# Patient Record
Sex: Female | Born: 1955 | Race: Black or African American | Hispanic: No | Marital: Single | Smoking: Never smoker
Health system: Southern US, Community
[De-identification: ages and names within clinical notes are randomized; demographics above are authoritative.]

## PROBLEM LIST (undated history)

## (undated) DIAGNOSIS — L309 Dermatitis, unspecified: Secondary | ICD-10-CM

## (undated) DIAGNOSIS — Z789 Other specified health status: Secondary | ICD-10-CM

## (undated) DIAGNOSIS — J45909 Unspecified asthma, uncomplicated: Secondary | ICD-10-CM

## (undated) DIAGNOSIS — D219 Benign neoplasm of connective and other soft tissue, unspecified: Secondary | ICD-10-CM

## (undated) HISTORY — PX: ABDOMINAL HYSTERECTOMY: SHX81

## (undated) HISTORY — DX: Benign neoplasm of connective and other soft tissue, unspecified: D21.9

---

## 1997-10-31 ENCOUNTER — Emergency Department (HOSPITAL_COMMUNITY): Admission: EM | Admit: 1997-10-31 | Discharge: 1997-10-31 | Payer: Self-pay | Admitting: Emergency Medicine

## 1997-11-12 ENCOUNTER — Emergency Department (HOSPITAL_COMMUNITY): Admission: EM | Admit: 1997-11-12 | Discharge: 1997-11-12 | Payer: Self-pay | Admitting: Emergency Medicine

## 1998-10-10 ENCOUNTER — Emergency Department (HOSPITAL_COMMUNITY): Admission: EM | Admit: 1998-10-10 | Discharge: 1998-10-10 | Payer: Self-pay | Admitting: Emergency Medicine

## 1998-12-21 ENCOUNTER — Encounter: Admission: RE | Admit: 1998-12-21 | Discharge: 1998-12-21 | Payer: Self-pay | Admitting: Family Medicine

## 1998-12-21 ENCOUNTER — Encounter: Payer: Self-pay | Admitting: Family Medicine

## 2000-02-15 ENCOUNTER — Encounter: Admission: RE | Admit: 2000-02-15 | Discharge: 2000-02-15 | Payer: Self-pay | Admitting: Family Medicine

## 2000-02-15 ENCOUNTER — Encounter: Payer: Self-pay | Admitting: Family Medicine

## 2000-03-15 ENCOUNTER — Encounter: Payer: Self-pay | Admitting: Family Medicine

## 2000-03-15 ENCOUNTER — Encounter: Admission: RE | Admit: 2000-03-15 | Discharge: 2000-03-15 | Payer: Self-pay | Admitting: Family Medicine

## 2000-11-23 ENCOUNTER — Other Ambulatory Visit: Admission: RE | Admit: 2000-11-23 | Discharge: 2000-11-23 | Payer: Self-pay | Admitting: *Deleted

## 2000-11-23 ENCOUNTER — Encounter (INDEPENDENT_AMBULATORY_CARE_PROVIDER_SITE_OTHER): Payer: Self-pay | Admitting: Specialist

## 2001-10-10 ENCOUNTER — Other Ambulatory Visit: Admission: RE | Admit: 2001-10-10 | Discharge: 2001-10-10 | Payer: Self-pay | Admitting: *Deleted

## 2003-01-15 ENCOUNTER — Other Ambulatory Visit: Admission: RE | Admit: 2003-01-15 | Discharge: 2003-01-15 | Payer: Self-pay | Admitting: *Deleted

## 2004-08-22 ENCOUNTER — Other Ambulatory Visit: Admission: RE | Admit: 2004-08-22 | Discharge: 2004-08-22 | Payer: Self-pay | Admitting: *Deleted

## 2005-07-27 ENCOUNTER — Other Ambulatory Visit: Admission: RE | Admit: 2005-07-27 | Discharge: 2005-07-27 | Payer: Self-pay | Admitting: Family Medicine

## 2005-10-27 ENCOUNTER — Encounter: Admission: RE | Admit: 2005-10-27 | Discharge: 2005-10-27 | Payer: Self-pay | Admitting: Family Medicine

## 2006-01-15 ENCOUNTER — Encounter: Admission: RE | Admit: 2006-01-15 | Discharge: 2006-01-15 | Payer: Self-pay | Admitting: Family Medicine

## 2006-08-03 ENCOUNTER — Emergency Department (HOSPITAL_COMMUNITY): Admission: EM | Admit: 2006-08-03 | Discharge: 2006-08-03 | Payer: Self-pay | Admitting: Emergency Medicine

## 2007-01-16 ENCOUNTER — Other Ambulatory Visit: Admission: RE | Admit: 2007-01-16 | Discharge: 2007-01-16 | Payer: Self-pay | Admitting: Obstetrics and Gynecology

## 2008-04-22 ENCOUNTER — Emergency Department: Payer: Self-pay | Admitting: Unknown Physician Specialty

## 2008-11-25 ENCOUNTER — Ambulatory Visit: Payer: Self-pay

## 2009-10-19 ENCOUNTER — Ambulatory Visit (HOSPITAL_COMMUNITY): Admission: RE | Admit: 2009-10-19 | Discharge: 2009-10-19 | Payer: Self-pay | Admitting: Family Medicine

## 2010-03-20 ENCOUNTER — Encounter: Payer: Self-pay | Admitting: Family Medicine

## 2010-09-02 ENCOUNTER — Emergency Department: Payer: Self-pay | Admitting: Emergency Medicine

## 2010-11-30 ENCOUNTER — Ambulatory Visit: Payer: Self-pay

## 2010-12-01 ENCOUNTER — Ambulatory Visit: Payer: Self-pay

## 2011-06-01 ENCOUNTER — Emergency Department: Payer: Self-pay | Admitting: *Deleted

## 2011-12-20 ENCOUNTER — Ambulatory Visit: Payer: Self-pay

## 2013-02-12 ENCOUNTER — Ambulatory Visit: Payer: Self-pay

## 2013-12-23 ENCOUNTER — Emergency Department: Payer: Self-pay | Admitting: Emergency Medicine

## 2013-12-23 LAB — COMPREHENSIVE METABOLIC PANEL
ALBUMIN: 3.5 g/dL (ref 3.4–5.0)
AST: 24 U/L (ref 15–37)
Alkaline Phosphatase: 95 U/L
Anion Gap: 7 (ref 7–16)
BUN: 10 mg/dL (ref 7–18)
Bilirubin,Total: 0.4 mg/dL (ref 0.2–1.0)
CO2: 31 mmol/L (ref 21–32)
CREATININE: 0.88 mg/dL (ref 0.60–1.30)
Calcium, Total: 8.2 mg/dL — ABNORMAL LOW (ref 8.5–10.1)
Chloride: 106 mmol/L (ref 98–107)
EGFR (Non-African Amer.): >60
GLUCOSE: 92 mg/dL (ref 65–99)
OSMOLALITY: 286 (ref 275–301)
Potassium: 3.2 mmol/L — ABNORMAL LOW (ref 3.5–5.1)
SGPT (ALT): 25 U/L
Sodium: 144 mmol/L (ref 136–145)
TOTAL PROTEIN: 7.5 g/dL (ref 6.4–8.2)

## 2013-12-23 LAB — CBC
HCT: 37.2 % (ref 35.0–47.0)
HGB: 11.9 g/dL — ABNORMAL LOW (ref 12.0–16.0)
MCH: 27 pg (ref 26.0–34.0)
MCHC: 31.9 g/dL — ABNORMAL LOW (ref 32.0–36.0)
MCV: 85 fL (ref 80–100)
PLATELETS: 172 10*3/uL (ref 150–440)
RBC: 4.4 10*6/uL (ref 3.80–5.20)
RDW: 13.5 % (ref 11.5–14.5)
WBC: 5.4 10*3/uL (ref 3.6–11.0)

## 2013-12-23 LAB — TROPONIN I

## 2013-12-23 LAB — D-DIMER(ARMC): D-DIMER: 241 ng/mL

## 2013-12-23 LAB — CK TOTAL AND CKMB (NOT AT ARMC)
CK, Total: 271 U/L — ABNORMAL HIGH
CK-MB: 1.8 ng/mL (ref 0.5–3.6)

## 2015-04-27 ENCOUNTER — Emergency Department
Admission: EM | Admit: 2015-04-27 | Discharge: 2015-04-27 | Disposition: A | Discharge status: Home / Self Care | Payer: No Typology Code available for payment source | Attending: Emergency Medicine | Admitting: Emergency Medicine

## 2015-04-27 ENCOUNTER — Encounter: Payer: Self-pay | Admitting: Emergency Medicine

## 2015-04-27 DIAGNOSIS — Y9389 Activity, other specified: Secondary | ICD-10-CM | POA: Diagnosis not present

## 2015-04-27 DIAGNOSIS — M62838 Other muscle spasm: Secondary | ICD-10-CM

## 2015-04-27 DIAGNOSIS — S0990XA Unspecified injury of head, initial encounter: Secondary | ICD-10-CM | POA: Insufficient documentation

## 2015-04-27 DIAGNOSIS — S8991XA Unspecified injury of right lower leg, initial encounter: Secondary | ICD-10-CM | POA: Diagnosis not present

## 2015-04-27 DIAGNOSIS — Y998 Other external cause status: Secondary | ICD-10-CM | POA: Insufficient documentation

## 2015-04-27 DIAGNOSIS — Y92410 Unspecified street and highway as the place of occurrence of the external cause: Secondary | ICD-10-CM | POA: Insufficient documentation

## 2015-04-27 DIAGNOSIS — S4992XA Unspecified injury of left shoulder and upper arm, initial encounter: Secondary | ICD-10-CM | POA: Diagnosis not present

## 2015-04-27 DIAGNOSIS — S199XXA Unspecified injury of neck, initial encounter: Secondary | ICD-10-CM | POA: Diagnosis present

## 2015-04-27 MED ORDER — CYCLOBENZAPRINE HCL 10 MG PO TABS: 10.0000 mg | ORAL_TABLET | Freq: Three times a day (TID) | ORAL | Status: DC | PRN

## 2015-04-27 MED ORDER — NAPROXEN SODIUM 550 MG PO TABS: 550.0000 mg | ORAL_TABLET | Freq: Two times a day (BID) | ORAL | Status: DC
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
## 2016-03-21 ENCOUNTER — Other Ambulatory Visit: Payer: Self-pay | Admitting: Obstetrics and Gynecology

## 2016-03-21 DIAGNOSIS — Z1231 Encounter for screening mammogram for malignant neoplasm of breast: Secondary | ICD-10-CM

## 2016-04-12 ENCOUNTER — Ambulatory Visit
Admission: RE | Admit: 2016-04-12 | Discharge: 2016-04-12 | Disposition: A | Discharge status: Home / Self Care | Payer: BLUE CROSS/BLUE SHIELD | Source: Ambulatory Visit | Attending: Obstetrics and Gynecology | Admitting: Obstetrics and Gynecology

## 2016-04-12 DIAGNOSIS — Z1231 Encounter for screening mammogram for malignant neoplasm of breast: Secondary | ICD-10-CM | POA: Diagnosis not present

## 2016-05-08 ENCOUNTER — Ambulatory Visit (INDEPENDENT_AMBULATORY_CARE_PROVIDER_SITE_OTHER): Payer: BLUE CROSS/BLUE SHIELD | Admitting: Obstetrics and Gynecology

## 2016-05-08 ENCOUNTER — Encounter: Payer: Self-pay | Admitting: Obstetrics and Gynecology

## 2016-05-08 VITALS — BP 114/70 | Ht 65.0 in | Wt 155.0 lb

## 2016-05-08 DIAGNOSIS — Z0000 Encounter for general adult medical examination without abnormal findings: Secondary | ICD-10-CM

## 2016-05-08 DIAGNOSIS — Z113 Encounter for screening for infections with a predominantly sexual mode of transmission: Secondary | ICD-10-CM

## 2016-05-08 DIAGNOSIS — Z13220 Encounter for screening for lipoid disorders: Secondary | ICD-10-CM

## 2016-05-08 DIAGNOSIS — Z131 Encounter for screening for diabetes mellitus: Secondary | ICD-10-CM | POA: Diagnosis not present

## 2016-05-08 DIAGNOSIS — R3121 Asymptomatic microscopic hematuria: Secondary | ICD-10-CM

## 2016-05-08 DIAGNOSIS — Z1329 Encounter for screening for other suspected endocrine disorder: Secondary | ICD-10-CM | POA: Diagnosis not present

## 2016-05-08 DIAGNOSIS — Z124 Encounter for screening for malignant neoplasm of cervix: Secondary | ICD-10-CM

## 2016-05-08 DIAGNOSIS — N3001 Acute cystitis with hematuria: Secondary | ICD-10-CM

## 2016-05-08 DIAGNOSIS — Z01419 Encounter for gynecological examination (general) (routine) without abnormal findings: Secondary | ICD-10-CM

## 2016-05-08 NOTE — Progress Notes (Addendum)
Routine Annual Gynecology Examination   PCP: No PCP Per Patient  Chief Complaint  Patient presents with  . Annual Exam    History of Present Illness: Patient is a 61 y.o. [id number] presents for annual exam. The patient has no complaints today.   Menopausal bleeding: denies  Menopausal symptoms: denies  Breast symptoms: reports a skin lesion on her right side that began as a pimple in 2012 and has steadily gotten bigger. She states that she was told that the spot looked like a keloid when she was getting a mammogram.   Last pap smear: 2 years ago.  Result Normal  (Per patient report)  Last mammogram: 1 month ago.  Result Normal   Has been having a week of "loud" and abnormal smelling urine. Would like a UA.   Past Medical History:  Diagnosis Date  . Fibroids    Past Surgical History:  Procedure Laterality Date  . ABDOMINAL HYSTERECTOMY     pt still has ovaries and cervix   Medications:   Medication Sig Start Date End Date Taking? Authorizing Provider  cyclobenzaprine (FLEXERIL) 10 MG tablet Take 1 tablet (10 mg total) by mouth 3 (three) times daily as needed for muscle spasms. Patient not taking: Reported on 05/08/2016 04/27/15   Jami L Hagler, PA-C  naproxen sodium (ANAPROX) 550 MG tablet Take 1 tablet (550 mg total) by mouth 2 (two) times daily with a meal. Patient not taking: Reported on 05/08/2016 04/27/15   Jami L Hagler, PA-C    Allergies: No Known Allergies  Gynecologic History:  No LMP recorded. Patient has had a hysterectomy. Contraception: none Last Pap: 2 years ago. Results were: normal Last mammogram: 1 month ago. Results were: normal  Obstetric History: [id number]  Social History   Social History  . Marital status: Single    Spouse name: N/A  . Number of children: N/A  . Years of education: N/A   Occupational History  . Not on file.   Social History Main Topics  . Smoking status: Never Smoker  . Smokeless tobacco: Never Used  . Alcohol use No    . Drug use: No  . Sexual activity: Yes    Birth control/ protection: Surgical   Other Topics Concern  . Not on file   Social History Narrative  . No narrative on file    Family History  Problem Relation Age of Onset  . Breast cancer Paternal Aunt 109  . Lung cancer Mother     Review of Systems  Constitutional: Negative.   HENT: Negative.   Eyes: Negative.  Negative for discharge.  Respiratory: Negative.   Cardiovascular: Negative.   Gastrointestinal: Negative.   Genitourinary: Negative.   Musculoskeletal: Negative.   Skin:       Mark on right breast near axilla. Present to increasing degrees since 2012. Itches.   Endo/Heme/Allergies: Negative.   Psychiatric/Behavioral: Negative.      Physical Exam Vitals: BP 114/70   Ht 5\' 5"  (1.651 m)   Wt 155 lb (70.3 kg)   BMI 25.79 kg/m   Physical Exam  Constitutional: She is oriented to person, place, and time and well-developed, well-nourished, and in no distress. No distress.  HENT:  Head: Normocephalic and atraumatic.  Eyes: Conjunctivae and EOM are normal. Right eye exhibits no discharge. Left eye exhibits no discharge.  Neck: Neck supple. No tracheal deviation present. No thyromegaly present.  Cardiovascular: Normal rate, regular rhythm, normal heart sounds and intact distal pulses.  Exam reveals no gallop  and no friction rub.   No murmur heard. Pulmonary/Chest: Effort normal and breath sounds normal. No respiratory distress. She has no wheezes. She has no rales. She exhibits no tenderness.    Abdominal: Soft. Bowel sounds are normal. She exhibits no distension and no mass. There is tenderness (mild tenderness in LLQ). There is no rebound and no guarding.  Genitourinary: Vagina normal, right adnexa normal, left adnexa normal and vulva normal. Right adnexum displays no mass. Left adnexum displays no mass.  Genitourinary Comments: Uterus surgically absent  Musculoskeletal: Normal range of motion. She exhibits no edema,  tenderness or deformity.  Lymphadenopathy:    She has no cervical adenopathy.  Neurological: She is alert and oriented to person, place, and time. No cranial nerve deficit.  Skin: Skin is warm and dry.  Psychiatric: Mood, memory, affect and judgment normal.     Female chaperone present for pelvic and breast  portions of the physical exam  Results:  Audit Questionaire: 1 PHQ-9: 0  Assessment and Plan:  61 y.o. [id number] for routine annual exam.  Right axillary skin lesion, appears keloidal.   Plan: Problem List Items Addressed This Visit    None    Visit Diagnoses    Women's annual routine gynecological examination    -  Primary   Screening for diabetes mellitus       Relevant Orders   Hgb A1c w/o eAG   Screen for STD (sexually transmitted disease)       Relevant Orders   IGP,CtNg,AptimaHPV,rfx16/18,45   Pap smear for cervical cancer screening       Relevant Orders   IGP,CtNg,AptimaHPV,rfx16/18,45   Screening for thyroid disorder       Relevant Orders   TSH   Laboratory exam ordered as part of routine general medical examination       Relevant Orders   Comprehensive metabolic panel   CBC   Screening for lipid disorders       Relevant Orders   Lipid Panel With LDL/HDL Ratio   Asymptomatic microscopic hematuria       Relevant Orders   Urine culture      1) Mammogram  - recommend yearly screening mammogram: normal last month. Routine screening recommended.   2) STI screening was offered done  3) Pap smear - ASCCP guidelines and rational discussed.  Patient opts for routine screening interval - Pap  done  4) Osteoporosis  - per USPTF routine screening DEXA at age 46  5) Routine healthcare maintenance including cholesterol, diabetes screening done today  6) Keloidal lesion: Recommend she see a dermatologist. Offered referral, if needed.  7) Hematuria: send urine culture.  Refer to Urology, if negative  8) Follow up 1 year for routine annual  Prentice Docker, MD 05/08/2016 10:17 AM    ADDENDUM: Urine culture returned showing pansensitive e. Coli. Bactrim DS sent to pharmacy for patient.   She will be notified.

## 2016-05-09 LAB — COMPREHENSIVE METABOLIC PANEL
ALBUMIN: 4.2 g/dL (ref 3.5–5.5)
ALK PHOS: 101 IU/L (ref 39–117)
ALT: 19 IU/L (ref 0–32)
AST: 21 IU/L (ref 0–40)
Albumin/Globulin Ratio: 1.4 (ref 1.2–2.2)
BILIRUBIN TOTAL: 0.2 mg/dL (ref 0.0–1.2)
BUN / CREAT RATIO: 13 (ref 9–23)
BUN: 8 mg/dL (ref 6–24)
CHLORIDE: 104 mmol/L (ref 96–106)
CO2: 25 mmol/L (ref 18–29)
Calcium: 9.1 mg/dL (ref 8.7–10.2)
Creatinine, Ser: 0.63 mg/dL (ref 0.57–1.00)
GFR calc Af Amer: 114 mL/min/{1.73_m2} (ref >59)
GFR calc non Af Amer: 99 mL/min/{1.73_m2} (ref >59)
GLUCOSE: 107 mg/dL — AB (ref 65–99)
Globulin, Total: 3 g/dL (ref 1.5–4.5)
POTASSIUM: 3.8 mmol/L (ref 3.5–5.2)
SODIUM: 143 mmol/L (ref 134–144)
Total Protein: 7.2 g/dL (ref 6.0–8.5)

## 2016-05-09 LAB — HGB A1C W/O EAG: Hgb A1c MFr Bld: 5.7 % — ABNORMAL HIGH (ref 4.8–5.6)

## 2016-05-09 LAB — LIPID PANEL WITH LDL/HDL RATIO
CHOLESTEROL TOTAL: 238 mg/dL — AB (ref 100–199)
HDL: 68 mg/dL (ref >39)
LDL Calculated: 154 mg/dL — ABNORMAL HIGH (ref 0–99)
LDl/HDL Ratio: 2.3 ratio units (ref 0.0–3.2)
TRIGLYCERIDES: 81 mg/dL (ref 0–149)
VLDL Cholesterol Cal: 16 mg/dL (ref 5–40)

## 2016-05-09 LAB — CBC
HEMATOCRIT: 37.8 % (ref 34.0–46.6)
Hemoglobin: 12.4 g/dL (ref 11.1–15.9)
MCH: 27.3 pg (ref 26.6–33.0)
MCHC: 32.8 g/dL (ref 31.5–35.7)
MCV: 83 fL (ref 79–97)
PLATELETS: 217 10*3/uL (ref 150–379)
RBC: 4.55 x10E6/uL (ref 3.77–5.28)
RDW: 15.2 % (ref 12.3–15.4)
WBC: 6.4 10*3/uL (ref 3.4–10.8)

## 2016-05-09 LAB — TSH: TSH: 2.36 u[IU]/mL (ref 0.450–4.500)

## 2016-05-11 LAB — URINE CULTURE

## 2016-05-11 LAB — IGP,CTNG,APTIMAHPV,RFX16/18,45
Chlamydia, Nuc. Acid Amp: NEGATIVE
GONOCOCCUS BY NUCLEIC ACID AMP: NEGATIVE
HPV APTIMA: NEGATIVE
PAP SMEAR COMMENT: 0

## 2016-05-13 ENCOUNTER — Encounter: Payer: Self-pay | Admitting: Obstetrics and Gynecology

## 2016-05-13 MED ORDER — SULFAMETHOXAZOLE-TRIMETHOPRIM 800-160 MG PO TABS: 1.0000 | ORAL_TABLET | Freq: Two times a day (BID) | ORAL | 0 refills | Status: AC

## 2016-05-13 NOTE — Addendum Note (Signed)
Addended by: Prentice Docker D on: 05/13/2016 07:29 PM   Modules accepted: Orders

## 2016-05-17 ENCOUNTER — Telehealth: Payer: Self-pay

## 2016-05-17 NOTE — Telephone Encounter (Signed)
Spoke w/patient. Notified SDJ had sent her results thru my chart. Patient states she isn't sure how to get into my chart and asks that SDJ not send her messages thru my chart. Patient verbally given results that SDJ sent thru my chart. Patient states she did p/u abx from pharmacy. Patient did not have any further questions. Sticky note created for no communication thru my chart.

## 2016-05-17 NOTE — Telephone Encounter (Signed)
Pt calling for lab results from 05/08/16. [phone]

## 2016-08-04 DIAGNOSIS — R5381 Other malaise: Secondary | ICD-10-CM | POA: Diagnosis not present

## 2016-08-04 DIAGNOSIS — I10 Essential (primary) hypertension: Secondary | ICD-10-CM | POA: Diagnosis not present

## 2016-08-08 DIAGNOSIS — E876 Hypokalemia: Secondary | ICD-10-CM | POA: Diagnosis not present

## 2016-09-29 DIAGNOSIS — E639 Nutritional deficiency, unspecified: Secondary | ICD-10-CM | POA: Diagnosis not present

## 2016-09-29 DIAGNOSIS — R5383 Other fatigue: Secondary | ICD-10-CM | POA: Diagnosis not present

## 2016-09-29 DIAGNOSIS — E889 Metabolic disorder, unspecified: Secondary | ICD-10-CM | POA: Diagnosis not present

## 2016-09-29 DIAGNOSIS — E668 Other obesity: Secondary | ICD-10-CM | POA: Diagnosis not present

## 2016-10-03 DIAGNOSIS — L910 Hypertrophic scar: Secondary | ICD-10-CM | POA: Diagnosis not present

## 2016-10-03 DIAGNOSIS — R946 Abnormal results of thyroid function studies: Secondary | ICD-10-CM | POA: Diagnosis not present

## 2016-10-03 DIAGNOSIS — Z113 Encounter for screening for infections with a predominantly sexual mode of transmission: Secondary | ICD-10-CM | POA: Diagnosis not present

## 2016-10-03 DIAGNOSIS — Z0000 Encounter for general adult medical examination without abnormal findings: Secondary | ICD-10-CM | POA: Diagnosis not present

## 2016-10-03 DIAGNOSIS — Z136 Encounter for screening for cardiovascular disorders: Secondary | ICD-10-CM | POA: Diagnosis not present

## 2016-10-03 DIAGNOSIS — Z1389 Encounter for screening for other disorder: Secondary | ICD-10-CM | POA: Diagnosis not present

## 2016-10-03 DIAGNOSIS — E876 Hypokalemia: Secondary | ICD-10-CM | POA: Diagnosis not present

## 2016-10-03 DIAGNOSIS — R87610 Atypical squamous cells of undetermined significance on cytologic smear of cervix (ASC-US): Secondary | ICD-10-CM | POA: Diagnosis not present

## 2016-10-03 DIAGNOSIS — D500 Iron deficiency anemia secondary to blood loss (chronic): Secondary | ICD-10-CM | POA: Diagnosis not present

## 2016-11-14 DIAGNOSIS — L910 Hypertrophic scar: Secondary | ICD-10-CM | POA: Diagnosis not present

## 2016-11-14 DIAGNOSIS — D485 Neoplasm of uncertain behavior of skin: Secondary | ICD-10-CM | POA: Diagnosis not present

## 2016-11-14 DIAGNOSIS — N6459 Other signs and symptoms in breast: Secondary | ICD-10-CM | POA: Diagnosis not present

## 2017-01-23 DIAGNOSIS — Z87891 Personal history of nicotine dependence: Secondary | ICD-10-CM | POA: Diagnosis not present

## 2017-01-23 DIAGNOSIS — M5489 Other dorsalgia: Secondary | ICD-10-CM | POA: Diagnosis not present

## 2017-01-23 DIAGNOSIS — M67911 Unspecified disorder of synovium and tendon, right shoulder: Secondary | ICD-10-CM | POA: Diagnosis not present

## 2017-01-23 DIAGNOSIS — L299 Pruritus, unspecified: Secondary | ICD-10-CM | POA: Diagnosis not present

## 2017-01-23 DIAGNOSIS — M25511 Pain in right shoulder: Secondary | ICD-10-CM | POA: Diagnosis not present

## 2017-01-23 DIAGNOSIS — B9689 Other specified bacterial agents as the cause of diseases classified elsewhere: Secondary | ICD-10-CM | POA: Diagnosis not present

## 2017-01-23 DIAGNOSIS — N760 Acute vaginitis: Secondary | ICD-10-CM | POA: Diagnosis not present

## 2017-03-26 ENCOUNTER — Encounter: Payer: Self-pay | Admitting: *Deleted

## 2017-03-26 ENCOUNTER — Ambulatory Visit
Admission: EM | Admit: 2017-03-26 | Discharge: 2017-03-26 | Disposition: A | Discharge status: Home / Self Care | Payer: 59 | Attending: Family Medicine | Admitting: Family Medicine

## 2017-03-26 DIAGNOSIS — N760 Acute vaginitis: Secondary | ICD-10-CM | POA: Insufficient documentation

## 2017-03-26 DIAGNOSIS — B9689 Other specified bacterial agents as the cause of diseases classified elsewhere: Secondary | ICD-10-CM | POA: Insufficient documentation

## 2017-03-26 DIAGNOSIS — Z113 Encounter for screening for infections with a predominantly sexual mode of transmission: Secondary | ICD-10-CM

## 2017-03-26 DIAGNOSIS — N898 Other specified noninflammatory disorders of vagina: Secondary | ICD-10-CM

## 2017-03-26 LAB — URINALYSIS, COMPLETE (UACMP) WITH MICROSCOPIC
Bilirubin Urine: NEGATIVE
GLUCOSE, UA: NEGATIVE mg/dL
Ketones, ur: NEGATIVE mg/dL
Leukocytes, UA: NEGATIVE
NITRITE: NEGATIVE
PROTEIN: NEGATIVE mg/dL
SPECIFIC GRAVITY, URINE: 1.025 (ref 1.005–1.030)
pH: 6 (ref 5.0–8.0)

## 2017-03-26 LAB — WET PREP, GENITAL
SPERM: NONE SEEN
Trich, Wet Prep: NONE SEEN
Yeast Wet Prep HPF POC: NONE SEEN

## 2017-03-26 LAB — GLUCOSE, CAPILLARY: Glucose-Capillary: 92 mg/dL (ref 65–99)

## 2017-03-26 MED ORDER — METRONIDAZOLE 500 MG PO TABS: 500.0000 mg | ORAL_TABLET | Freq: Two times a day (BID) | ORAL | 0 refills | Status: AC

## 2017-03-26 NOTE — ED Triage Notes (Signed)
Vaginal discharge, itching, x2 days.

## 2017-03-26 NOTE — Discharge Instructions (Signed)
Take medication as prescribed. Rest. Drink plenty of fluids. No alcohol with medication.    Follow up with your primary care physician as discussed. Return to Urgent care for new or worsening concerns.

## 2017-03-26 NOTE — ED Provider Notes (Signed)
[facility] ____________________________________________  Time seen: Approximately 9:15 PM  I have reviewed the triage vital signs and the nursing notes.   HISTORY  Chief Complaint Vaginal Discharge   HPI Beth Tucker is a 62 y.o. female presenting for evaluation of 2 days of some vaginal itching and vaginal discharge.  States vaginal discharge is whitish with infectious type odor.  States sexually active with same partner with rather last sexual activity approximately 2 weeks ago.  States would like to have gonorrhea and Chlamydia testing, declines other STD testing.  States not concerned highly for STDs.  Denies any vaginal pain, rash, lesions, pelvic pain, abdominal pain, fevers, vomiting or diarrhea.  Reports continues to eat and drink well.  States not diabetic.  Denies known trigger.  States history of vaginal yeast infections with similar presentation.  States last vaginal yeast infection was 2 or 3 months ago.  Reports otherwise feels well denies other complaints.  No over-the-counter medications taken for the same complaints.  Denies any accompanying urinary frequency, urinary urgency or burning with urination. Denies recent sickness. Denies recent antibiotic use.    Past Medical History:  Diagnosis Date  . Fibroids     There are no active problems to display for this patient.   Past Surgical History:  Procedure Laterality Date  . ABDOMINAL HYSTERECTOMY     pt still has ovaries and cervix     No current facility-administered medications for this encounter.   Current Outpatient Medications:  .  metroNIDAZOLE (FLAGYL) 500 MG tablet, Take 1 tablet (500 mg total) by mouth 2 (two) times daily for 7 days., Disp: 14 tablet, Rfl: 0  Allergies Neosporin [neomycin-bacitracin zn-polymyx]  Family History  Problem Relation Age of Onset  . Breast cancer Paternal Aunt 20  . Lung cancer Mother   . Alcoholism Father   . Hypertension Father     Social  History Social History   Tobacco Use  . Smoking status: Never Smoker  . Smokeless tobacco: Never Used  Substance Use Topics  . Alcohol use: No  . Drug use: No    Review of Systems Constitutional: No fever/chills Cardiovascular: Denies chest pain. Respiratory: Denies shortness of breath. Gastrointestinal: No abdominal pain. Genitourinary: Negative for dysuria. As above.  Musculoskeletal: Negative for back pain. Skin: Negative for rash.   ____________________________________________   PHYSICAL EXAM:  VITAL SIGNS: ED Triage Vitals  Enc Vitals Group     BP 03/26/17 2031 119/72     Pulse Rate 03/26/17 2031 71     Resp 03/26/17 2031 16     Temp 03/26/17 2031 98.4 F (36.9 C)     Temp Source 03/26/17 2031 Oral     SpO2 03/26/17 2031 100 %     Weight 03/26/17 2032 155 lb (70.3 kg)     Height 03/26/17 2032 5\' 4"  (1.626 m)     Head Circumference --      Peak Flow --      Pain Score 03/26/17 2145 3     Pain Loc --      Pain Edu? --      Excl. in [place]? --     Constitutional: Alert and oriented. Well appearing and in no acute distress. Cardiovascular: Normal rate, regular rhythm. Grossly normal heart sounds.  Good peripheral circulation. Respiratory: Normal respiratory effort without tachypnea nor retractions. Breath sounds are clear and equal bilaterally. No wheezes, rales, rhonchi. Gastrointestinal: Soft and nontender.No CVA tenderness. Pelvic: declined Musculoskeletal: No midline cervical, thoracic or lumbar  tenderness to palpation.  Neurologic:  Normal speech and language.Speech is normal. No gait instability.  Skin:  Skin is warm, dry and intact. No rash noted. Psychiatric: Mood and affect are normal. Speech and behavior are normal. Patient exhibits appropriate insight and judgment   ___________________________________________   LABS (all labs ordered are listed, but only abnormal results are displayed)  Labs Reviewed  WET PREP, GENITAL - Abnormal; Notable for the  following components:      Result Value   Clue Cells Wet Prep HPF POC PRESENT (*)    WBC, Wet Prep HPF POC FEW (*)    All other components within normal limits  URINALYSIS, COMPLETE (UACMP) WITH MICROSCOPIC - Abnormal; Notable for the following components:   APPearance HAZY (*)    Hgb urine dipstick SMALL (*)    Squamous Epithelial / LPF 6-30 (*)    Bacteria, UA FEW (*)    All other components within normal limits  URINE CULTURE  CHLAMYDIA/NGC RT PCR ([facility] ONLY)  GLUCOSE, CAPILLARY  CBG MONITORING, ED    PROCEDURES Procedures    INITIAL IMPRESSION / ASSESSMENT AND PLAN / ED COURSE  Pertinent labs & imaging results that were available during my care of the patient were reviewed by me and considered in my medical decision making (see chart for details).  Very well-appearing patient.  No acute distress.  Urinalysis reviewed, suspect contamination, patient denies dysuria patient declined pelvic exam and elected to do self wet prep and self gonorrhea and chlamydia.  Results reviewed, awaiting GC chlamydia.  Positive clue cells.  Will treat patient with oral Flagyl.  Glucose fingerstick normal.  Discussed pelvic rest, no alcohol with medication, encourage rest, fluids, supportive care.  Encouraged primary or OB/GYN follow-up.Discussed indication, risks and benefits of medications with patient.  Discussed follow up with Primary care physician this week. Discussed follow up and return parameters including no resolution or any worsening concerns. Patient verbalized understanding and agreed to plan.   ____________________________________________   FINAL CLINICAL IMPRESSION(S) / ED DIAGNOSES  Final diagnoses:  BV (bacterial vaginosis)  Acute vaginitis     ED Discharge Orders        Ordered    metroNIDAZOLE (FLAGYL) 500 MG tablet  2 times daily     03/26/17 2142       Note: This dictation was prepared with Dragon dictation along with smaller phrase technology. Any  transcriptional errors that result from this process are unintentional.         Marylene Land, NP 03/26/17 2200

## 2017-03-27 LAB — CHLAMYDIA/NGC RT PCR (ARMC ONLY)
Chlamydia Tr: NOT DETECTED
N gonorrhoeae: NOT DETECTED

## 2017-03-28 LAB — URINE CULTURE

## 2017-04-13 DIAGNOSIS — E876 Hypokalemia: Secondary | ICD-10-CM | POA: Diagnosis not present

## 2017-04-13 DIAGNOSIS — J310 Chronic rhinitis: Secondary | ICD-10-CM | POA: Diagnosis not present

## 2017-04-13 DIAGNOSIS — J309 Allergic rhinitis, unspecified: Secondary | ICD-10-CM | POA: Diagnosis not present

## 2017-04-20 ENCOUNTER — Telehealth: Payer: Self-pay | Admitting: Gastroenterology

## 2017-04-20 NOTE — Telephone Encounter (Signed)
PCP wants patient to schedule procedure.

## 2017-04-23 ENCOUNTER — Telehealth: Payer: Self-pay

## 2017-04-23 ENCOUNTER — Other Ambulatory Visit: Payer: Self-pay

## 2017-04-23 NOTE — Telephone Encounter (Signed)
LVM for pt to contact office to schedule colonoscopy. 

## 2017-04-24 ENCOUNTER — Telehealth: Payer: Self-pay

## 2017-04-24 NOTE — Telephone Encounter (Signed)
Gastroenterology Pre-Procedure Review  Request Date:  Requesting Physician: Dr.   PATIENT REVIEW QUESTIONS: The patient responded to the following health history questions as indicated:    1. Are you having any GI issues? no 2. Do you have a personal history of Polyps? no 3. Do you have a family history of Colon Cancer or Polyps? no 4. Diabetes Mellitus? no 5. Joint replacements in the past 12 months?no 6. Major health problems in the past 3 months?no 7. Any artificial heart valves, MVP, or defibrillator?no    MEDICATIONS & ALLERGIES:    Patient reports the following regarding taking any anticoagulation/antiplatelet therapy:   Plavix, Coumadin, Eliquis, Xarelto, Lovenox, Pradaxa, Brilinta, or Effient? no Aspirin? no  Patient confirms/reports the following medications:  Current Outpatient Medications  Medication Sig Dispense Refill  . ibuprofen (ADVIL,MOTRIN) 600 MG tablet Take 600 mg by mouth.    . triamcinolone acetonide (TRIESENCE) 40 MG/ML SUSP 40 mg.     No current facility-administered medications for this visit.     Patient confirms/reports the following allergies:  Allergies  Allergen Reactions  . Neosporin [Neomycin-Bacitracin Zn-Polymyx] Rash    No orders of the defined types were placed in this encounter.   AUTHORIZATION INFORMATION Primary Insurance: 1D#: Group #:  Secondary Insurance: 1D#: Group #:  SCHEDULE INFORMATION: Date:  Time: Location:

## 2017-04-26 ENCOUNTER — Other Ambulatory Visit: Payer: Self-pay

## 2017-04-26 DIAGNOSIS — Z1211 Encounter for screening for malignant neoplasm of colon: Secondary | ICD-10-CM

## 2017-04-26 NOTE — Telephone Encounter (Signed)
Gastroenterology Pre-Procedure Review  Request Date: 06/21/17 Requesting Physician: Dr. Allen Norris  PATIENT REVIEW QUESTIONS: The patient responded to the following health history questions as indicated:    1. Are you having any GI issues? no 2. Do you have a personal history of Polyps? no 3. Do you have a family history of Colon Cancer or Polyps? no 4. Diabetes Mellitus? no 5. Joint replacements in the past 12 months?no 6. Major health problems in the past 3 months?no 7. Any artificial heart valves, MVP, or defibrillator?no    MEDICATIONS & ALLERGIES:    Patient reports the following regarding taking any anticoagulation/antiplatelet therapy:   Plavix, Coumadin, Eliquis, Xarelto, Lovenox, Pradaxa, Brilinta, or Effient? no Aspirin? no  Patient confirms/reports the following medications:  Current Outpatient Medications  Medication Sig Dispense Refill  . ibuprofen (ADVIL,MOTRIN) 600 MG tablet Take 600 mg by mouth.    . triamcinolone acetonide (TRIESENCE) 40 MG/ML SUSP 40 mg.     No current facility-administered medications for this visit.     Patient confirms/reports the following allergies:  Allergies  Allergen Reactions  . Neosporin [Neomycin-Bacitracin Zn-Polymyx] Rash    No orders of the defined types were placed in this encounter.   AUTHORIZATION INFORMATION Primary Insurance: 1D#: Group #:  Secondary Insurance: 1D#: Group #:  SCHEDULE INFORMATION: Date: 06/21/17 Time: Location:[facility]

## 2017-06-18 ENCOUNTER — Other Ambulatory Visit: Payer: Self-pay

## 2017-06-18 ENCOUNTER — Encounter: Payer: Self-pay | Admitting: *Deleted

## 2017-06-18 DIAGNOSIS — Z1211 Encounter for screening for malignant neoplasm of colon: Secondary | ICD-10-CM

## 2017-06-18 MED ORDER — PEG 3350-KCL-NA BICARB-NACL 420 G PO SOLR: 4000.0000 mL | Freq: Once | ORAL | 0 refills | Status: AC

## 2017-06-19 ENCOUNTER — Encounter: Payer: Self-pay | Admitting: Anesthesiology

## 2017-06-19 NOTE — Discharge Instructions (Signed)
General Anesthesia, Adult, Care After °These instructions provide you with information about caring for yourself after your procedure. Your health care provider may also give you more specific instructions. Your treatment has been planned according to current medical practices, but problems sometimes occur. Call your health care provider if you have any problems or questions after your procedure. °What can I expect after the procedure? °After the procedure, it is common to have: °· Vomiting. °· A sore throat. °· Mental slowness. ° °It is common to feel: °· Nauseous. °· Cold or shivery. °· Sleepy. °· Tired. °· Sore or achy, even in parts of your body where you did not have surgery. ° °Follow these instructions at home: °For at least 24 hours after the procedure: °· Do not: °? Participate in activities where you could fall or become injured. °? Drive. °? Use heavy machinery. °? Drink alcohol. °? Take sleeping pills or medicines that cause drowsiness. °? Make important decisions or sign legal documents. °? Take care of children on your own. °· Rest. °Eating and drinking °· If you vomit, drink water, juice, or soup when you can drink without vomiting. °· Drink enough fluid to keep your urine clear or pale yellow. °· Make sure you have little or no nausea before eating solid foods. °· Follow the diet recommended by your health care provider. °General instructions °· Have a responsible adult stay with you until you are awake and alert. °· Return to your normal activities as told by your health care provider. Ask your health care provider what activities are safe for you. °· Take over-the-counter and prescription medicines only as told by your health care provider. °· If you smoke, do not smoke without supervision. °· Keep all follow-up visits as told by your health care provider. This is important. °Contact a health care provider if: °· You continue to have nausea or vomiting at home, and medicines are not helpful. °· You  cannot drink fluids or start eating again. °· You cannot urinate after 8-12 hours. °· You develop a skin rash. °· You have fever. °· You have increasing redness at the site of your procedure. °Get help right away if: °· You have difficulty breathing. °· You have chest pain. °· You have unexpected bleeding. °· You feel that you are having a life-threatening or urgent problem. °This information is not intended to replace advice given to you by your health care provider. Make sure you discuss any questions you have with your health care provider. °Document Released: 05/22/2000 Document Revised: 07/19/2015 Document Reviewed: 01/28/2015 °Elsevier Interactive Patient Education © 2018 [employer] Inc[employer] ° °

## 2017-06-21 ENCOUNTER — Telehealth: Payer: Self-pay | Admitting: Gastroenterology

## 2017-06-21 ENCOUNTER — Ambulatory Visit: Admission: RE | Admit: 2017-06-21 | Payer: 59 | Source: Ambulatory Visit | Admitting: Gastroenterology

## 2017-06-21 HISTORY — DX: Other specified health status: Z78.9

## 2017-06-21 SURGERY — COLONOSCOPY WITH PROPOFOL
Anesthesia: General

## 2017-06-21 NOTE — Telephone Encounter (Signed)
Pt states she was supposed to have colonoscopy this morning but vomitted the prep she would like to reschedul;e and know what else she can take instead

## 2017-06-26 NOTE — Telephone Encounter (Signed)
Beth Tucker will callback with available date for reschedule.  Prep instructions at Reception.

## 2017-07-02 ENCOUNTER — Ambulatory Visit
Admission: EM | Admit: 2017-07-02 | Discharge: 2017-07-02 | Disposition: A | Discharge status: Home / Self Care | Payer: 59 | Attending: Family Medicine | Admitting: Family Medicine

## 2017-07-02 ENCOUNTER — Encounter: Payer: Self-pay | Admitting: Gynecology

## 2017-07-02 DIAGNOSIS — N760 Acute vaginitis: Secondary | ICD-10-CM

## 2017-07-02 DIAGNOSIS — B9689 Other specified bacterial agents as the cause of diseases classified elsewhere: Secondary | ICD-10-CM

## 2017-07-02 LAB — URINALYSIS, COMPLETE (UACMP) WITH MICROSCOPIC
Bacteria, UA: NONE SEEN
Bilirubin Urine: NEGATIVE
GLUCOSE, UA: NEGATIVE mg/dL
Leukocytes, UA: NEGATIVE
NITRITE: NEGATIVE
PH: 5.5 (ref 5.0–8.0)
Protein, ur: NEGATIVE mg/dL
Specific Gravity, Urine: 1.02 (ref 1.005–1.030)
WBC, UA: NONE SEEN WBC/hpf (ref 0–5)

## 2017-07-02 LAB — CHLAMYDIA/NGC RT PCR (ARMC ONLY)
Chlamydia Tr: NOT DETECTED
N gonorrhoeae: NOT DETECTED

## 2017-07-02 LAB — WET PREP, GENITAL
SPERM: NONE SEEN
Trich, Wet Prep: NONE SEEN
Yeast Wet Prep HPF POC: NONE SEEN

## 2017-07-02 MED ORDER — CLINDAMYCIN PHOSPHATE 100 MG VA SUPP: 100.0000 mg | Freq: Every day | VAGINAL | 0 refills | Status: DC

## 2017-07-02 NOTE — ED Provider Notes (Signed)
[facility]    CSN: [id number] Arrival date & time: 07/02/17  1638     History   Chief Complaint Chief Complaint  Patient presents with  . Urinary Tract Infection    HPI Beth Tucker is a 62 y.o. female.   HPI  62 year old female presents with low back and side pain and a vaginal odor with mild discharge.  She  had similar symptoms in January 2019 where she was treated with Flagyl but because due to the size of the pill was having difficulty finishing the entire course.  She has a monogamous relationship with a single female partner. Denies any frequency or urgency and has no dysuria.  He has had no fever or chills.  Not concerned for other STDs.        Past Medical History:  Diagnosis Date  . Fibroids   . Medical history non-contributory     There are no active problems to display for this patient.   Past Surgical History:  Procedure Laterality Date  . ABDOMINAL HYSTERECTOMY     pt still has ovaries and cervix    OB History    Gravida  4   Para  3   Term  3   Preterm      AB  1   Living  3     SAB      TAB      Ectopic      Multiple      Live Births  3            Home Medications    Prior to Admission medications   Medication Sig Start Date End Date Taking? Authorizing Provider  ibuprofen (ADVIL,MOTRIN) 600 MG tablet Take 600 mg by mouth. 01/23/17  Yes [provider]  triamcinolone acetonide (TRIESENCE) 40 MG/ML SUSP 40 mg. 11/14/16  Yes [provider]  clindamycin (CLEOCIN) 100 MG vaginal suppository Place 1 suppository (100 mg total) vaginally at bedtime. 07/02/17   Lorin Picket, PA-C    Family History Family History  Problem Relation Age of Onset  . Breast cancer Paternal Aunt 32  . Lung cancer Mother   . Alcoholism Father   . Hypertension Father     Social History Social History   Tobacco Use  . Smoking status: Never Smoker  . Smokeless tobacco: Never Used  Substance Use Topics  .  Alcohol use: No  . Drug use: No     Allergies   Latex; Neosporin [neomycin-bacitracin zn-polymyx]; and Peanut-containing drug products   Review of Systems Review of Systems  Genitourinary: Positive for pelvic pain and vaginal discharge. Negative for dyspareunia, dysuria, frequency, genital sores, hematuria, urgency and vaginal pain.  Musculoskeletal: Positive for back pain.     Physical Exam Triage Vital Signs ED Triage Vitals  Enc Vitals Group     BP 07/02/17 1652 116/66     Pulse Rate 07/02/17 1652 85     Resp 07/02/17 1652 16     Temp 07/02/17 1652 98.3 F (36.8 C)     Temp Source 07/02/17 1652 Oral     SpO2 07/02/17 1652 99 %     Weight 07/02/17 1650 155 lb (70.3 kg)     Height --      Head Circumference --      Peak Flow --      Pain Score 07/02/17 1650 0     Pain Loc --      Pain Edu? --  Excl. in GC? --    No data found.  Updated Vital Signs BP 116/66 (BP Location: Left Arm)   Pulse 85   Temp 98.3 F (36.8 C) (Oral)   Resp 16   Wt 155 lb (70.3 kg)   SpO2 99%   BMI 26.61 kg/m   Visual Acuity Right Eye Distance:   Left Eye Distance:   Bilateral Distance:    Right Eye Near:   Left Eye Near:    Bilateral Near:     Physical Exam  Constitutional: She is oriented to person, place, and time. She appears well-developed and well-nourished. No distress.  HENT:  Head: Normocephalic.  Eyes: Pupils are equal, round, and reactive to light. EOM are normal. Right eye exhibits no discharge. Left eye exhibits no discharge.  Neck: Normal range of motion.  Genitourinary: Vaginal discharge found.  Genitourinary Comments: Examination performed with Rosendo Gros, CMA as assistant and chaperone.  External genitalia is normal.  There is a's mild amount of white thin discharge at the introitus.  No labial lesions are identified.  Speculum exam showed thin white watery mucus in the fornix.  Samples were obtained and submitted to laboratory.  Bimanual showed no adnexal  tenderness.  Uterus was surgically absent.  Musculoskeletal: Normal range of motion.  Neurological: She is alert and oriented to person, place, and time.  Skin: Skin is warm and dry. She is not diaphoretic.  Psychiatric: She has a normal mood and affect. Her behavior is normal. Judgment and thought content normal.  Nursing note and vitals reviewed.    UC Treatments / Results  Labs (all labs ordered are listed, but only abnormal results are displayed) Labs Reviewed  WET PREP, GENITAL - Abnormal; Notable for the following components:      Result Value   Clue Cells Wet Prep HPF POC PRESENT (*)    WBC, Wet Prep HPF POC FEW (*)    All other components within normal limits  URINALYSIS, COMPLETE (UACMP) WITH MICROSCOPIC - Abnormal; Notable for the following components:   Hgb urine dipstick MODERATE (*)    Ketones, ur TRACE (*)    All other components within normal limits  CHLAMYDIA/NGC RT PCR [facility] ONLY)    EKG None  Radiology No results found.  Procedures Procedures (including critical care time)  Medications Ordered in UC Medications - No data to display  Initial Impression / Assessment and Plan / UC Course  I have reviewed the triage vital signs and the nursing notes.  Pertinent labs & imaging results that were available during my care of the patient were reviewed by me and considered in my medical decision making (see chart for details).     Plan: 1. Test/x-ray results and diagnosis reviewed with patient 2. rx as per orders; risks, benefits, potential side effects reviewed with patient 3. Recommend supportive treatment with vaginal cream since she was unable to complete her previous course of treatment due to the size of the medication.  She has opted for the clindamycin ovules 100 mg that she will use intravaginally at nighttime for 3 days.  She was cautioned regarding the use of Tums and to abstain from sex during the treatment..  She has any further problems  we have referred her to a primary care physician. 4. F/u prn if symptoms worsen or don't improve  Final Clinical Impressions(s) / UC Diagnoses   Final diagnoses:  BV (bacterial vaginosis)   Discharge Instructions   None    ED Prescriptions  Medication Sig Dispense Auth. Provider   clindamycin (CLEOCIN) 100 MG vaginal suppository Place 1 suppository (100 mg total) vaginally at bedtime. 3 suppository Lorin Picket, PA-C     Controlled Substance Prescriptions [place] Controlled Substance Registry consulted? Not Applicable   Lorin Picket, PA-C 07/02/17 1749

## 2017-07-02 NOTE — ED Triage Notes (Signed)
Patient c/o low back/ side pain and odor .Pt patent would like to be swab for wet prep and GC/chylmidia.

## 2017-07-03 NOTE — Progress Notes (Signed)
All std screenings are negative, attempted to reach patient. No answer.

## 2017-10-02 ENCOUNTER — Other Ambulatory Visit: Payer: Self-pay

## 2017-10-02 DIAGNOSIS — Z1211 Encounter for screening for malignant neoplasm of colon: Secondary | ICD-10-CM

## 2017-10-02 MED ORDER — NA SULFATE-K SULFATE-MG SULF 17.5-3.13-1.6 GM/177ML PO SOLN: 1.0000 | Freq: Once | ORAL | 0 refills | Status: AC

## 2017-10-11 ENCOUNTER — Telehealth: Payer: Self-pay

## 2017-10-11 NOTE — Telephone Encounter (Signed)
LVM for pt to contact office to reschedule her colonoscopy from 10/22/17 with Dr. Marius Ditch due to schedule change.  Thanks [employer]

## 2017-10-15 ENCOUNTER — Encounter: Payer: Self-pay | Admitting: *Deleted

## 2017-10-15 ENCOUNTER — Telehealth: Payer: Self-pay

## 2017-10-15 ENCOUNTER — Other Ambulatory Visit: Payer: Self-pay

## 2017-10-15 DIAGNOSIS — Z1211 Encounter for screening for malignant neoplasm of colon: Secondary | ICD-10-CM

## 2017-10-15 NOTE — Telephone Encounter (Signed)
LVM for pt to contact office to reschedule her colonoscopy from 10/22/17 with Dr. Marius Ditch due to schedule change.  Thanks [employer]

## 2017-10-18 NOTE — Discharge Instructions (Signed)
General Anesthesia, Adult, Care After °These instructions provide you with information about caring for yourself after your procedure. Your health care provider may also give you more specific instructions. Your treatment has been planned according to current medical practices, but problems sometimes occur. Call your health care provider if you have any problems or questions after your procedure. °What can I expect after the procedure? °After the procedure, it is common to have: °· Vomiting. °· A sore throat. °· Mental slowness. ° °It is common to feel: °· Nauseous. °· Cold or shivery. °· Sleepy. °· Tired. °· Sore or achy, even in parts of your body where you did not have surgery. ° °Follow these instructions at home: °For at least 24 hours after the procedure: °· Do not: °? Participate in activities where you could fall or become injured. °? Drive. °? Use heavy machinery. °? Drink alcohol. °? Take sleeping pills or medicines that cause drowsiness. °? Make important decisions or sign legal documents. °? Take care of children on your own. °· Rest. °Eating and drinking °· If you vomit, drink water, juice, or soup when you can drink without vomiting. °· Drink enough fluid to keep your urine clear or pale yellow. °· Make sure you have little or no nausea before eating solid foods. °· Follow the diet recommended by your health care provider. °General instructions °· Have a responsible adult stay with you until you are awake and alert. °· Return to your normal activities as told by your health care provider. Ask your health care provider what activities are safe for you. °· Take over-the-counter and prescription medicines only as told by your health care provider. °· If you smoke, do not smoke without supervision. °· Keep all follow-up visits as told by your health care provider. This is important. °Contact a health care provider if: °· You continue to have nausea or vomiting at home, and medicines are not helpful. °· You  cannot drink fluids or start eating again. °· You cannot urinate after 8-12 hours. °· You develop a skin rash. °· You have fever. °· You have increasing redness at the site of your procedure. °Get help right away if: °· You have difficulty breathing. °· You have chest pain. °· You have unexpected bleeding. °· You feel that you are having a life-threatening or urgent problem. °This information is not intended to replace advice given to you by your health care provider. Make sure you discuss any questions you have with your health care provider. °Document Released: 05/22/2000 Document Revised: 07/19/2015 Document Reviewed: 01/28/2015 °Elsevier Interactive Patient Education © 2018 [employer] Inc[employer] ° °

## 2017-10-22 ENCOUNTER — Encounter
Admission: RE | Disposition: A | Discharge status: Home / Self Care | Payer: Self-pay | Source: Ambulatory Visit | Attending: Gastroenterology

## 2017-10-22 ENCOUNTER — Ambulatory Visit: Payer: 59 | Admitting: Anesthesiology

## 2017-10-22 ENCOUNTER — Ambulatory Visit
Admission: RE | Admit: 2017-10-22 | Discharge: 2017-10-22 | Disposition: A | Discharge status: Home / Self Care | Payer: 59 | Source: Ambulatory Visit | Attending: Gastroenterology | Admitting: Gastroenterology

## 2017-10-22 ENCOUNTER — Ambulatory Visit: Admit: 2017-10-22 | Payer: 59 | Admitting: Gastroenterology

## 2017-10-22 DIAGNOSIS — Z1211 Encounter for screening for malignant neoplasm of colon: Secondary | ICD-10-CM

## 2017-10-22 HISTORY — PX: COLONOSCOPY WITH PROPOFOL: SHX5780

## 2017-10-22 SURGERY — COLONOSCOPY WITH PROPOFOL
Anesthesia: General

## 2017-10-22 SURGERY — COLONOSCOPY WITH PROPOFOL
Anesthesia: General | Site: Rectum | Wound class: Dirty or Infected

## 2017-10-22 MED ORDER — ACETAMINOPHEN 160 MG/5ML PO SOLN: 325.0000 mg | Freq: Once | ORAL | Status: DC

## 2017-10-22 MED ORDER — SODIUM CHLORIDE 0.9 % IV SOLN: INTRAVENOUS | Status: DC

## 2017-10-22 MED ORDER — ACETAMINOPHEN 325 MG PO TABS: 325.0000 mg | ORAL_TABLET | Freq: Once | ORAL | Status: DC

## 2017-10-22 MED ORDER — LACTATED RINGERS IV SOLN
INTRAVENOUS | Status: DC
  Administered 2017-10-22 (×2): via INTRAVENOUS

## 2017-10-22 MED ORDER — PROPOFOL 10 MG/ML IV BOLUS
INTRAVENOUS | Status: DC | PRN
  Administered 2017-10-22: 40 mg via INTRAVENOUS
  Administered 2017-10-22: 30 mg via INTRAVENOUS
  Administered 2017-10-22 (×2): 20 mg via INTRAVENOUS
  Administered 2017-10-22: 120 mg via INTRAVENOUS

## 2017-10-22 MED ORDER — LIDOCAINE HCL (CARDIAC) PF 100 MG/5ML IV SOSY
PREFILLED_SYRINGE | INTRAVENOUS | Status: DC | PRN
  Administered 2017-10-22: 30 mg via INTRAVENOUS

## 2017-10-22 MED ORDER — LACTATED RINGERS IV SOLN
INTRAVENOUS | Status: DC
  Administered 2017-10-22: 09:00:00 via INTRAVENOUS

## 2017-10-22 MED ORDER — STERILE WATER FOR IRRIGATION IR SOLN
Status: DC | PRN
  Administered 2017-10-22: 10:00:00

## 2017-10-22 SURGICAL SUPPLY — 24 items
CANISTER SUCT 1200ML W/VALVE (MISCELLANEOUS) ×2 IMPLANT
CLIP HMST 235XBRD CATH ROT (MISCELLANEOUS) IMPLANT
CLIP RESOLUTION 360 11X235 (MISCELLANEOUS)
ELECT REM PT RETURN 9FT ADLT (ELECTROSURGICAL)
ELECTRODE REM PT RTRN 9FT ADLT (ELECTROSURGICAL) IMPLANT
FCP ESCP3.2XJMB 240X2.8X (MISCELLANEOUS)
FORCEPS BIOP RAD 4 LRG CAP 4 (CUTTING FORCEPS) IMPLANT
FORCEPS BIOP RJ4 240 W/NDL (MISCELLANEOUS)
FORCEPS ESCP3.2XJMB 240X2.8X (MISCELLANEOUS) IMPLANT
GOWN CVR UNV OPN BCK APRN NK (MISCELLANEOUS) ×2 IMPLANT
GOWN ISOL THUMB LOOP REG UNIV (MISCELLANEOUS) ×4
INJECTOR VARIJECT VIN23 (MISCELLANEOUS) IMPLANT
KIT DEFENDO VALVE AND CONN (KITS) IMPLANT
KIT ENDO PROCEDURE OLY (KITS) ×2 IMPLANT
MARKER SPOT ENDO TATTOO 5ML (MISCELLANEOUS) IMPLANT
PROBE APC STR FIRE (PROBE) IMPLANT
RETRIEVER NET ROTH 2.5X230 LF (MISCELLANEOUS) IMPLANT
SNARE SHORT THROW 13M SML OVAL (MISCELLANEOUS) IMPLANT
SNARE SHORT THROW 30M LRG OVAL (MISCELLANEOUS) IMPLANT
SNARE SNG USE RND 15MM (INSTRUMENTS) IMPLANT
SPOT EX ENDOSCOPIC TATTOO (MISCELLANEOUS)
TRAP ETRAP POLY (MISCELLANEOUS) IMPLANT
VARIJECT INJECTOR VIN23 (MISCELLANEOUS)
WATER STERILE IRR 250ML POUR (IV SOLUTION) ×2 IMPLANT

## 2017-10-22 NOTE — Op Note (Signed)
[facility] Gastroenterology Patient Name: Beth Tucker Procedure Date: 10/22/2017 9:25 AM MRN: [id number] Account #: [id number] Date of Birth: Oct 16, 1955 Admit Type: Outpatient Age: 62 Room: [facility] OR ROOM 01 Gender: Female Note Status: Finalized Procedure:            Colonoscopy Indications:          Screening for colorectal malignant neoplasm Providers:            [name] MD, MD Referring MD:         Cletis Athens, MD (Referring MD) Medicines:            Propofol per Anesthesia Complications:        No immediate complications. Procedure:            Pre-Anesthesia Assessment:                       - Prior to the procedure, a History and Physical was                        performed, and patient medications and allergies were                        reviewed. The patient's tolerance of previous                        anesthesia was also reviewed. The risks and benefits of                        the procedure and the sedation options and risks were                        discussed with the patient. All questions were                        answered, and informed consent was obtained. Prior                        Anticoagulants: The patient has taken no previous                        anticoagulant or antiplatelet agents. ASA Grade                        Assessment: II - A patient with mild systemic disease.                        After reviewing the risks and benefits, the patient was                        deemed in satisfactory condition to undergo the                        procedure.                       After obtaining informed consent, the colonoscope was                        passed under direct vision. Throughout the procedure,  the patient's blood pressure, pulse, and oxygen                        saturations were monitored continuously. The was                        introduced through the anus and advanced to the the              cecum, identified by appendiceal orifice and ileocecal                        valve. The colonoscopy was performed without                        difficulty. The patient tolerated the procedure well.                        The quality of the bowel preparation was excellent. Findings:      The perianal and digital rectal examinations were normal.      The colon (entire examined portion) appeared normal. Impression:           - The entire examined colon is normal.                       - No specimens collected. Recommendation:       - Discharge patient to home.                       - Resume previous diet.                       - Continue present medications.                       - Repeat colonoscopy in 10 years for screening unless                        any change in family history or lower GI problems. Procedure Code(s):    --- Professional ---                       [phone], Colonoscopy, flexible; diagnostic, including                        collection of specimen(s) by brushing or washing, when                        performed (separate procedure) Diagnosis Code(s):    --- Professional ---                       Z12.11, Encounter for screening for malignant neoplasm                        of colon CPT copyright 2017 American Medical Association. All rights reserved. The codes documented in this report are preliminary and upon coder review may  be revised to meet current compliance requirements. [name] MD, MD 10/22/2017 9:44:51 AM This report has been signed electronically. Number of Addenda: 0 Note Initiated On: 10/22/2017 9:25 AM Scope Withdrawal Time: 0 hours 6 minutes 15 seconds  Total Procedure Duration: 0 hours 8 minutes 36  seconds       [facility]

## 2017-10-22 NOTE — H&P (Signed)
Beth Lame, MD [address] [place], [place] [address] Phone: [phone] Fax : [phone]  Primary Care Physician:  Cletis Athens, MD Primary Gastroenterologist:  Dr. Allen Norris  Pre-Procedure History & Physical: HPI:  Beth Tucker is a 62 y.o. female is here for a screening colonoscopy.   Past Medical History:  Diagnosis Date  . Fibroids   . Medical history non-contributory     Past Surgical History:  Procedure Laterality Date  . ABDOMINAL HYSTERECTOMY     pt still has ovaries and cervix    Prior to Admission medications   Medication Sig Start Date End Date Taking? Authorizing Provider  cetirizine (ZYRTEC) 10 MG tablet Take 10 mg by mouth daily as needed for allergies.   Yes [provider]  fluticasone (FLONASE) 50 MCG/ACT nasal spray Place into both nostrils daily as needed for allergies or rhinitis.   Yes [provider]  IRON PO Take by mouth daily.   Yes [provider]    Allergies as of 10/15/2017 - Review Complete 10/15/2017  Allergen Reaction Noted  . Latex Rash 06/18/2017  . Neosporin [neomycin-bacitracin zn-polymyx] Rash 03/26/2017  . Peanut-containing drug products Rash 06/18/2017    Family History  Problem Relation Age of Onset  . Breast cancer Paternal Aunt 90  . Lung cancer Mother   . Alcoholism Father   . Hypertension Father     Social History   Socioeconomic History  . Marital status: Single    Spouse name: Not on file  . Number of children: Not on file  . Years of education: Not on file  . Highest education level: Not on file  Occupational History  . Not on file  Social Needs  . Financial resource strain: Not on file  . Food insecurity:    Worry: Not on file    Inability: Not on file  . Transportation needs:    Medical: Not on file    Non-medical: Not on file  Tobacco Use  . Smoking status: Never Smoker  . Smokeless tobacco: Never Used  Substance and Sexual Activity  . Alcohol use: No    . Drug use: No  . Sexual activity: Yes    Birth control/protection: Surgical  Lifestyle  . Physical activity:    Days per week: Not on file    Minutes per session: Not on file  . Stress: Not on file  Relationships  . Social connections:    Talks on phone: Not on file    Gets together: Not on file    Attends religious service: Not on file    Active member of club or organization: Not on file    Attends meetings of clubs or organizations: Not on file    Relationship status: Not on file  . Intimate partner violence:    Fear of current or ex partner: Not on file    Emotionally abused: Not on file    Physically abused: Not on file    Forced sexual activity: Not on file  Other Topics Concern  . Not on file  Social History Narrative  . Not on file    Review of Systems: See HPI, otherwise negative ROS  Physical Exam: BP 125/68   Pulse 69   Temp 97.9 F (36.6 C) (Temporal)   Resp 16   Ht 5\' 4"  (1.626 m)   Wt 71.2 kg   SpO2 100%   BMI 26.95 kg/m  General:   Alert,  pleasant and cooperative in  NAD Head:  Normocephalic and atraumatic. Neck:  Supple; no masses or thyromegaly. Lungs:  Clear throughout to auscultation.    Heart:  Regular rate and rhythm. Abdomen:  Soft, nontender and nondistended. Normal bowel sounds, without guarding, and without rebound.   Neurologic:  Alert and  oriented x4;  grossly normal neurologically.  Impression/Plan: Beth Tucker is now here to undergo a screening colonoscopy.  Risks, benefits, and alternatives regarding colonoscopy have been reviewed with the patient.  Questions have been answered.  All parties agreeable.

## 2017-10-22 NOTE — Transfer of Care (Signed)
Immediate Anesthesia Transfer of Care Note  Patient: Beth Tucker  Procedure(s) Performed: COLONOSCOPY WITH PROPOFOL (N/A Rectum)  Patient Location: PACU  Anesthesia Type: General  Level of Consciousness: awake, alert  and patient cooperative  Airway and Oxygen Therapy: Patient Spontanous Breathing and Patient connected to supplemental oxygen  Post-op Assessment: Post-op Vital signs reviewed, Patient's Cardiovascular Status Stable, Respiratory Function Stable, Patent Airway and No signs of Nausea or vomiting  Post-op Vital Signs: Reviewed and stable  Complications: No apparent anesthesia complications

## 2017-10-22 NOTE — Anesthesia Postprocedure Evaluation (Signed)
Anesthesia Post Note  Patient: Beth Tucker  Procedure(s) Performed: COLONOSCOPY WITH PROPOFOL (N/A Rectum)  Patient location during evaluation: PACU Anesthesia Type: General Level of consciousness: awake and alert and oriented Pain management: satisfactory to patient Vital Signs Assessment: post-procedure vital signs reviewed and stable Respiratory status: spontaneous breathing, nonlabored ventilation and respiratory function stable Cardiovascular status: blood pressure returned to baseline and stable Postop Assessment: Adequate PO intake and No signs of nausea or vomiting Anesthetic complications: no    Raliegh Ip

## 2017-10-22 NOTE — Anesthesia Procedure Notes (Signed)
Date/Time: 10/22/2017 9:31 AM Performed by: Cameron Ali, CRNA Pre-anesthesia Checklist: Patient identified, Emergency Drugs available, Suction available, Timeout performed and Patient being monitored Patient Re-evaluated:Patient Re-evaluated prior to induction Oxygen Delivery Method: Nasal cannula Placement Confirmation: positive ETCO2

## 2017-10-22 NOTE — Anesthesia Preprocedure Evaluation (Signed)
Anesthesia Evaluation  Patient identified by MRN, date of birth, ID band Patient awake    Reviewed: Allergy & Precautions, H&P , NPO status , Patient's Chart, lab work & pertinent test results  Airway Mallampati: III  TM Distance: >3 FB Neck ROM: full    Dental no notable dental hx.    Pulmonary    Pulmonary exam normal breath sounds clear to auscultation       Cardiovascular Normal cardiovascular exam Rhythm:regular Rate:Normal     Neuro/Psych    GI/Hepatic   Endo/Other    Renal/GU      Musculoskeletal   Abdominal   Peds  Hematology   Anesthesia Other Findings   Reproductive/Obstetrics                             Anesthesia Physical Anesthesia Plan  ASA: II  Anesthesia Plan: General   Post-op Pain Management:    Induction: Intravenous  PONV Risk Score and Plan: 3 and Propofol infusion and Treatment may vary due to age or medical condition  Airway Management Planned: Natural Airway  Additional Equipment:   Intra-op Plan:   Post-operative Plan:   Informed Consent: I have reviewed the patients History and Physical, chart, labs and discussed the procedure including the risks, benefits and alternatives for the proposed anesthesia with the patient or authorized representative who has indicated his/her understanding and acceptance.     Plan Discussed with: CRNA  Anesthesia Plan Comments:         Anesthesia Quick Evaluation

## 2017-10-23 ENCOUNTER — Encounter: Payer: Self-pay | Admitting: Gastroenterology

## 2017-11-13 ENCOUNTER — Other Ambulatory Visit: Payer: Self-pay | Admitting: Obstetrics and Gynecology

## 2017-11-13 DIAGNOSIS — Z1231 Encounter for screening mammogram for malignant neoplasm of breast: Secondary | ICD-10-CM

## 2017-11-13 DIAGNOSIS — Z01419 Encounter for gynecological examination (general) (routine) without abnormal findings: Secondary | ICD-10-CM | POA: Diagnosis not present

## 2017-11-13 DIAGNOSIS — R221 Localized swelling, mass and lump, neck: Secondary | ICD-10-CM | POA: Diagnosis not present

## 2017-11-13 DIAGNOSIS — R1011 Right upper quadrant pain: Secondary | ICD-10-CM | POA: Diagnosis not present

## 2017-11-22 ENCOUNTER — Ambulatory Visit: Payer: 59

## 2017-12-06 ENCOUNTER — Ambulatory Visit
Admission: RE | Admit: 2017-12-06 | Discharge: 2017-12-06 | Disposition: A | Discharge status: Home / Self Care | Payer: 59 | Source: Ambulatory Visit | Attending: Obstetrics and Gynecology | Admitting: Obstetrics and Gynecology

## 2017-12-06 DIAGNOSIS — Z1231 Encounter for screening mammogram for malignant neoplasm of breast: Secondary | ICD-10-CM | POA: Insufficient documentation

## 2017-12-20 ENCOUNTER — Other Ambulatory Visit: Payer: Self-pay | Admitting: Obstetrics and Gynecology

## 2017-12-20 DIAGNOSIS — M7989 Other specified soft tissue disorders: Secondary | ICD-10-CM

## 2017-12-27 ENCOUNTER — Other Ambulatory Visit: Payer: 59

## 2017-12-31 ENCOUNTER — Ambulatory Visit
Admission: RE | Admit: 2017-12-31 | Discharge: 2017-12-31 | Disposition: A | Discharge status: Home / Self Care | Payer: 59 | Source: Ambulatory Visit | Attending: Obstetrics and Gynecology | Admitting: Obstetrics and Gynecology

## 2017-12-31 DIAGNOSIS — M7989 Other specified soft tissue disorders: Secondary | ICD-10-CM | POA: Insufficient documentation

## 2017-12-31 DIAGNOSIS — R928 Other abnormal and inconclusive findings on diagnostic imaging of breast: Secondary | ICD-10-CM | POA: Diagnosis not present

## 2017-12-31 DIAGNOSIS — N6489 Other specified disorders of breast: Secondary | ICD-10-CM | POA: Diagnosis not present

## 2018-05-06 DIAGNOSIS — R509 Fever, unspecified: Secondary | ICD-10-CM | POA: Diagnosis not present

## 2018-05-06 DIAGNOSIS — J209 Acute bronchitis, unspecified: Secondary | ICD-10-CM | POA: Diagnosis not present

## 2018-08-19 ENCOUNTER — Encounter: Payer: Self-pay | Admitting: Emergency Medicine

## 2018-08-19 ENCOUNTER — Ambulatory Visit
Admission: EM | Admit: 2018-08-19 | Discharge: 2018-08-19 | Disposition: A | Discharge status: Home / Self Care | Payer: 59 | Attending: Emergency Medicine | Admitting: Emergency Medicine

## 2018-08-19 ENCOUNTER — Other Ambulatory Visit: Payer: Self-pay

## 2018-08-19 DIAGNOSIS — N760 Acute vaginitis: Secondary | ICD-10-CM | POA: Diagnosis not present

## 2018-08-19 DIAGNOSIS — B9689 Other specified bacterial agents as the cause of diseases classified elsewhere: Secondary | ICD-10-CM

## 2018-08-19 LAB — CHLAMYDIA/NGC RT PCR (ARMC ONLY): N gonorrhoeae: NOT DETECTED

## 2018-08-19 LAB — WET PREP, GENITAL
Sperm: NONE SEEN
Trich, Wet Prep: NONE SEEN
Yeast Wet Prep HPF POC: NONE SEEN

## 2018-08-19 LAB — CHLAMYDIA/NGC RT PCR (ARMC ONLY)??????????: Chlamydia Tr: NOT DETECTED

## 2018-08-19 MED ORDER — METRONIDAZOLE 500 MG PO TABS: 500.0000 mg | ORAL_TABLET | Freq: Two times a day (BID) | ORAL | 0 refills | Status: AC

## 2018-08-19 NOTE — ED Triage Notes (Signed)
Patient states she is have abdominal pain, related to new soap

## 2018-08-19 NOTE — ED Provider Notes (Signed)
[facility]    CSN: [id number] Arrival date & time: 08/19/18  1525     History   Chief Complaint Chief Complaint  Patient presents with   Abdominal Pain    HPI Beth Tucker is a 63 y.o. female.   Beth Tucker presents with complaints of vaginal itching and odor which she noted three days ago. No noted discharge. No pelvic pain or gi symptoms. Denies urinary symptoms. States has had similar with yeast infection in the past. Hasn't tried any medications for her symptoms. States she recently switched to a new soap and feels this may have contributed to her symptoms. Sexually active with 1 partner, doesn't use condoms. No known STD exposure. Without contributing medical history.      ROS per HPI, negative if not otherwise mentioned.      Past Medical History:  Diagnosis Date   Fibroids    Medical history non-contributory     Patient Active Problem List   Diagnosis Date Noted   Encounter for screening colonoscopy     Past Surgical History:  Procedure Laterality Date   ABDOMINAL HYSTERECTOMY     pt still has ovaries and cervix   COLONOSCOPY WITH PROPOFOL N/A 10/22/2017   Procedure: COLONOSCOPY WITH PROPOFOL;  Surgeon: [name], MD;  Location: [facility];  Service: Endoscopy;  Laterality: N/A;    OB History    Gravida  4   Para  3   Term  3   Preterm      AB  1   Living  3     SAB      TAB      Ectopic      Multiple      Live Births  3            Home Medications    Prior to Admission medications   Medication Sig Start Date End Date Taking? Authorizing Provider  cetirizine (ZYRTEC) 10 MG tablet Take 10 mg by mouth daily as needed for allergies.   Yes [provider]  fluticasone (FLONASE) 50 MCG/ACT nasal spray Place into both nostrils daily as needed for allergies or rhinitis.   Yes [provider]  IRON PO Take by mouth daily.    [provider]  metroNIDAZOLE (FLAGYL) 500 MG  tablet Take 1 tablet (500 mg total) by mouth 2 (two) times daily for 7 days. 08/19/18 08/26/18  Zigmund Gottron, NP    Family History Family History  Problem Relation Age of Onset   Breast cancer Paternal Aunt 51   Lung cancer Mother    Alcoholism Father    Hypertension Father     Social History Social History   Tobacco Use   Smoking status: Never Smoker   Smokeless tobacco: Never Used  Substance Use Topics   Alcohol use: No   Drug use: No     Allergies   Latex, Neosporin [neomycin-bacitracin zn-polymyx], and Peanut-containing drug products   Review of Systems Review of Systems   Physical Exam Triage Vital Signs ED Triage Vitals  Enc Vitals Group     BP 08/19/18 1620 108/61     Pulse Rate 08/19/18 1620 67     Resp 08/19/18 1620 18     Temp 08/19/18 1620 98.7 F (37.1 C)     Temp Source 08/19/18 1620 Oral     SpO2 08/19/18 1620 100 %     Weight 08/19/18 1623 150 lb (68 kg)  Height 08/19/18 1623 5\' 4"  (1.626 m)     Head Circumference --      Peak Flow --      Pain Score 08/19/18 1623 7     Pain Loc --      Pain Edu? --      Excl. in [place]? --    No data found.  Updated Vital Signs BP 108/61 (BP Location: Left Arm)    Pulse 67    Temp 98.7 F (37.1 C) (Oral)    Resp 18    Ht 5\' 4"  (1.626 m)    Wt 150 lb (68 kg)    SpO2 100%    BMI 25.75 kg/m   Visual Acuity Right Eye Distance:   Left Eye Distance:   Bilateral Distance:    Right Eye Near:   Left Eye Near:    Bilateral Near:     Physical Exam Constitutional:      General: She is not in acute distress.    Appearance: She is well-developed.  Cardiovascular:     Rate and Rhythm: Normal rate and regular rhythm.     Heart sounds: Normal heart sounds.  Pulmonary:     Effort: Pulmonary effort is normal.     Breath sounds: Normal breath sounds.  Abdominal:     Palpations: Abdomen is soft. Abdomen is not rigid.     Tenderness: There is no abdominal tenderness. There is no guarding or rebound.    Genitourinary:    Comments: Denies sores, lesions, vaginal bleeding; no pelvic pain; gu exam deferred at this time, vaginal self swab collected.   Skin:    General: Skin is warm and dry.  Neurological:     Mental Status: She is alert and oriented to person, place, and time.      UC Treatments / Results  Labs (all labs ordered are listed, but only abnormal results are displayed) Labs Reviewed  WET PREP, GENITAL - Abnormal; Notable for the following components:      Result Value   Clue Cells Wet Prep HPF POC PRESENT (*)    WBC, Wet Prep HPF POC RARE (*)    All other components within normal limits  CHLAMYDIA/NGC RT PCR ([facility] ONLY)    EKG None  Radiology No results found.  Procedures Procedures (including critical care time)  Medications Ordered in UC Medications - No data to display  Initial Impression / Assessment and Plan / UC Course  I have reviewed the triage vital signs and the nursing notes.  Pertinent labs & imaging results that were available during my care of the patient were reviewed by me and considered in my medical decision making (see chart for details).     Wet prep positive for clue cells, no yeast found. Flagyl initiated. Std screening pending. Will notify of any positive findings and if any changes to treatment are needed.  Return precautions provided. Patient verbalized understanding and agreeable to plan.   Final Clinical Impressions(s) / UC Diagnoses   Final diagnoses:  Bacterial vaginitis   Discharge Instructions   None    ED Prescriptions    Medication Sig Dispense Auth. Provider   metroNIDAZOLE (FLAGYL) 500 MG tablet Take 1 tablet (500 mg total) by mouth 2 (two) times daily for 7 days. 14 tablet Zigmund Gottron, NP     Controlled Substance Prescriptions [place] Controlled Substance Registry consulted? Not Applicable   Zigmund Gottron, NP 08/19/18 1816

## 2018-08-21 ENCOUNTER — Telehealth: Payer: Self-pay | Admitting: Emergency Medicine

## 2018-08-21 NOTE — Telephone Encounter (Signed)
Patient called regarding lab results. Negative GC/C results communicated.  Patient verbalized understanding.

## 2018-09-18 ENCOUNTER — Ambulatory Visit (INDEPENDENT_AMBULATORY_CARE_PROVIDER_SITE_OTHER): Payer: 59 | Admitting: Obstetrics and Gynecology

## 2018-09-18 ENCOUNTER — Encounter: Payer: Self-pay | Admitting: Obstetrics and Gynecology

## 2018-09-18 ENCOUNTER — Other Ambulatory Visit: Payer: Self-pay

## 2018-09-18 VITALS — BP 124/68 | HR 80 | Ht 63.0 in | Wt 162.0 lb

## 2018-09-18 DIAGNOSIS — Z1239 Encounter for other screening for malignant neoplasm of breast: Secondary | ICD-10-CM

## 2018-09-18 DIAGNOSIS — Z01419 Encounter for gynecological examination (general) (routine) without abnormal findings: Secondary | ICD-10-CM

## 2018-09-18 NOTE — Progress Notes (Signed)
Gynecology Annual Exam  PCP: Cletis Athens, MD  Chief Complaint:  Chief Complaint  Patient presents with  . Gynecologic Exam    [facility] noticed growth in vaginal area    History of Present Illness:Patient is a 63 y.o. [id number] presents for annual exam. The patient has no complaints today.   LMP: No LMP recorded. Patient has had a hysterectomy. Postmenopausal, status post supracervical hysterectomy  The patient is sexually active. She denies dyspareunia.  There is no notable family history of breast or ovarian cancer in her family.  The patient wears seatbelts: yes.   The patient has regular exercise: not asked.    The patient denies current symptoms of depression.     Was told vaginal lesion size of an egg noted at time of exam at [facility].  She states she has not experienced any vaginal bleeding or discomfort, has not noted an external lesions.    Review of Systems: Review of Systems  Constitutional: Negative for chills and fever.  HENT: Negative for congestion.   Respiratory: Negative for cough and shortness of breath.   Cardiovascular: Negative for chest pain and palpitations.  Gastrointestinal: Negative for abdominal pain, constipation, diarrhea, heartburn, nausea and vomiting.  Genitourinary: Negative for dysuria, frequency and urgency.  Skin: Negative for itching and rash.  Neurological: Negative for dizziness and headaches.  Endo/Heme/Allergies: Negative for polydipsia.  Psychiatric/Behavioral: Negative for depression.    Past Medical History:  Past Medical History:  Diagnosis Date  . Fibroids   . Medical history non-contributory     Past Surgical History:  Past Surgical History:  Procedure Laterality Date  . ABDOMINAL HYSTERECTOMY     pt still has ovaries and cervix  . COLONOSCOPY WITH PROPOFOL N/A 10/22/2017   Procedure: COLONOSCOPY WITH PROPOFOL;  Surgeon: [name], MD;  Location: [facility];  Service: Endoscopy;  Laterality: N/A;     Gynecologic History:  No LMP recorded. Patient has had a hysterectomy. Last Pap: Results were: 05/08/2016 NIL and HR HPV negative  Last mammogram: 12/31/2017 Results were: Gillian Shields I  Obstetric History: [id number]  Family History:  Family History  Problem Relation Age of Onset  . Breast cancer Paternal Aunt 17  . Lung cancer Mother   . Alcoholism Father   . Hypertension Father     Social History:  Social History   Socioeconomic History  . Marital status: Single    Spouse name: Not on file  . Number of children: Not on file  . Years of education: Not on file  . Highest education level: Not on file  Occupational History  . Not on file  Social Needs  . Financial resource strain: Not on file  . Food insecurity    Worry: Not on file    Inability: Not on file  . Transportation needs    Medical: Not on file    Non-medical: Not on file  Tobacco Use  . Smoking status: Never Smoker  . Smokeless tobacco: Never Used  Substance and Sexual Activity  . Alcohol use: No  . Drug use: No  . Sexual activity: Yes    Birth control/protection: Surgical  Lifestyle  . Physical activity    Days per week: Not on file    Minutes per session: Not on file  . Stress: Not on file  Relationships  . Social Herbalist on phone: Not on file    Gets together: Not on file    Attends religious service: Not on  file    Active member of club or organization: Not on file    Attends meetings of clubs or organizations: Not on file    Relationship status: Not on file  . Intimate partner violence    Fear of current or ex partner: Not on file    Emotionally abused: Not on file    Physically abused: Not on file    Forced sexual activity: Not on file  Other Topics Concern  . Not on file  Social History Narrative  . Not on file    Allergies:  Allergies  Allergen Reactions  . Latex Rash    Gloves and condoms  . Neosporin [Neomycin-Bacitracin Zn-Polymyx] Rash  . Peanut-Containing Drug  Products Rash    (peanuts)    Medications: Prior to Admission medications   Medication Sig Start Date End Date Taking? Authorizing Provider  cetirizine (ZYRTEC) 10 MG tablet Take 10 mg by mouth daily as needed for allergies.   Yes [provider]  fluticasone (FLONASE) 50 MCG/ACT nasal spray Place into both nostrils daily as needed for allergies or rhinitis.   Yes [provider]  IRON PO Take by mouth daily.   Yes [provider]    Physical Exam Vitals: Blood pressure 124/68, pulse 80, height 5\' 3"  (1.6 m), weight 162 lb (73.5 kg).  General: NAD HEENT: normocephalic, anicteric Thyroid: no enlargement, no palpable nodules Pulmonary: No increased work of breathing, CTAB Cardiovascular: RRR, distal pulses 2+ Breast: Breast symmetrical, no tenderness, no palpable nodules or masses, no skin or nipple retraction present, no nipple discharge.  No axillary or supraclavicular lymphadenopathy. Abdomen: NABS, soft, non-tender, non-distended.  Umbilicus without lesions.  No hepatomegaly, splenomegaly or masses palpable. No evidence of hernia  Genitourinary:  External: Normal external female genitalia.  Normal urethral meatus, normal Bartholin's and Skene's glands.    Vagina: Normal vaginal mucosa, no evidence of prolapse.    Cervix: Grossly normal in appearance, no bleeding  Uterus: surgically absent  Adnexa: ovaries non-enlarged, no adnexal masses  Rectal: deferred  Lymphatic: no evidence of inguinal lymphadenopathy Extremities: no edema, erythema, or tenderness Neurologic: Grossly intact Psychiatric: mood appropriate, affect full  Female chaperone present for pelvic and breast  portions of the physical exam     Assessment: 63 y.o. [id number] routine annual exam  Plan: Problem List Items Addressed This Visit    None    Visit Diagnoses    Encounter for gynecological examination without abnormal finding    -  Primary   Breast screening       Relevant Orders    MM 3D SCREEN BREAST BILATERAL      1) Mammogram - recommend yearly screening mammogram.  Mammogram Is up to date  2) STI screening  was notoffered and therefore not obtained  3) ASCCP guidelines and rational discussed.  Patient opts for every 3 years screening interval  4) Osteoporosis  - per USPTF routine screening DEXA at age 58  Consider FDA-approved medical therapies in postmenopausal women and men aged 72 years and older, based on the following: a) A hip or vertebral (clinical or morphometric) fracture b) T-score ? -2.5 at the femoral neck or spine after appropriate evaluation to exclude secondary causes C) Low bone mass (T-score between -1.0 and -2.5 at the femoral neck or spine) and a 10-year probability of a hip fracture ? 3% or a 10-year probability of a major osteoporosis-related fracture ? 20% based on the [place]-adapted WHO algorithm   5) Routine healthcare maintenance including  cholesterol, diabetes screening discussed managed by PCP  6) Colonoscopy UTD 10/22/2017  7) Vaginal lesion noted at [facility] - on my exam I appreciate not lesions.  Exam was conducted with a bivalved and sims speculum.  Normal appearing cervix.  No evidence of urethral diverticulu, gartner's duct cyst, or other vaginal lesion.  8) Return in about 1 year (around 09/18/2019) for annual.    Malachy Mood, MD [place]etta Pigeon, [facility] 09/18/2018, 3:39 PM

## 2018-10-21 ENCOUNTER — Other Ambulatory Visit: Payer: Self-pay | Admitting: Obstetrics and Gynecology

## 2018-10-21 ENCOUNTER — Telehealth: Payer: Self-pay

## 2018-10-21 MED ORDER — FLUTICASONE PROPIONATE 50 MCG/ACT NA SUSP: 2.0000 | Freq: Every day | NASAL | 2 refills | Status: DC

## 2018-10-21 MED ORDER — CETIRIZINE HCL 10 MG PO TABS: 10.0000 mg | ORAL_TABLET | Freq: Every day | ORAL | 4 refills | Status: DC | PRN

## 2018-10-21 NOTE — Telephone Encounter (Signed)
Patient requesting refill on her allergy med

## 2018-10-21 NOTE — Telephone Encounter (Signed)
Spoke w/pt to clarify which rx or both. Patient verified she needs cetrizine only. Flonase makes sores appear in her nose.

## 2018-10-21 NOTE — Telephone Encounter (Signed)
Sent future refills for this need to be via PCP

## 2018-10-22 NOTE — Telephone Encounter (Signed)
LMVM to notify pt of refill & future requests to be from PCP (Dr. Lavera Guise)

## 2019-02-25 ENCOUNTER — Ambulatory Visit
Admission: EM | Admit: 2019-02-25 | Discharge: 2019-02-25 | Disposition: A | Discharge status: Home / Self Care | Payer: 59 | Attending: Internal Medicine | Admitting: Internal Medicine

## 2019-02-25 ENCOUNTER — Other Ambulatory Visit: Payer: Self-pay

## 2019-02-25 DIAGNOSIS — R21 Rash and other nonspecific skin eruption: Secondary | ICD-10-CM

## 2019-02-25 DIAGNOSIS — T492X5A Adverse effect of local astringents and local detergents, initial encounter: Secondary | ICD-10-CM

## 2019-02-25 DIAGNOSIS — L243 Irritant contact dermatitis due to cosmetics: Secondary | ICD-10-CM

## 2019-02-25 DIAGNOSIS — L299 Pruritus, unspecified: Secondary | ICD-10-CM | POA: Diagnosis not present

## 2019-02-25 MED ORDER — PREDNISONE 20 MG PO TABS: 20.0000 mg | ORAL_TABLET | Freq: Every day | ORAL | 0 refills | Status: AC

## 2019-02-25 MED ORDER — ALBUTEROL SULFATE HFA 108 (90 BASE) MCG/ACT IN AERS: 1.0000 | INHALATION_SPRAY | Freq: Four times a day (QID) | RESPIRATORY_TRACT | 1 refills | Status: DC | PRN

## 2019-02-25 MED ORDER — HYDROXYZINE HCL 25 MG PO TABS: 25.0000 mg | ORAL_TABLET | Freq: Four times a day (QID) | ORAL | 0 refills | Status: AC

## 2019-02-25 MED ORDER — LORATADINE 10 MG PO TABS: 10.0000 mg | ORAL_TABLET | Freq: Every day | ORAL | 1 refills | Status: DC

## 2019-02-25 NOTE — ED Provider Notes (Signed)
[facility]    CSN: [id number] Arrival date & time: 02/25/19  1514      History   Chief Complaint Chief Complaint  Patient presents with  . Allergic Reaction    HPI Beth Tucker is a 63 y.o. female with no past medical history comes to urgent care with concerns of generalized itching and a rash of 4 days duration.  Patient recently changed her detergent to downy fabric softener.  Following that the patient started developing the rash.  She denies any difficulty breathing or wheezing.  No nausea or vomiting.  No chest tightness.  Rash is over the torso and legs.  Her hands and feet have been spared.  She has tried over-the-counter medications with no improvement.  No fever or chills.  No known aggravating factors.Marland Kitchen   HPI  Past Medical History:  Diagnosis Date  . Fibroids   . Medical history non-contributory     Patient Active Problem List   Diagnosis Date Noted  . Encounter for screening colonoscopy     Past Surgical History:  Procedure Laterality Date  . ABDOMINAL HYSTERECTOMY     pt still has ovaries and cervix  . COLONOSCOPY WITH PROPOFOL N/A 10/22/2017   Procedure: COLONOSCOPY WITH PROPOFOL;  Surgeon: [name], MD;  Location: [facility];  Service: Endoscopy;  Laterality: N/A;    OB History    Gravida  4   Para  3   Term  3   Preterm      AB  1   Living  3     SAB      TAB      Ectopic      Multiple      Live Births  3            Home Medications    Prior to Admission medications   Medication Sig Start Date End Date Taking? Authorizing Provider  fluticasone (FLONASE) 50 MCG/ACT nasal spray Place 2 sprays into both nostrils daily. 10/21/18  Yes Malachy Mood, MD  IRON PO Take by mouth daily.   Yes [provider]  albuterol (VENTOLIN HFA) 108 (90 Base) MCG/ACT inhaler Inhale 1 puff into the lungs every 6 (six) hours as needed for wheezing or shortness of breath. 02/25/19   Chase Picket, MD    hydrOXYzine (ATARAX/VISTARIL) 25 MG tablet Take 1 tablet (25 mg total) by mouth every 6 (six) hours for 5 days. 02/25/19 03/02/19  Chase Picket, MD  loratadine (CLARITIN) 10 MG tablet Take 1 tablet (10 mg total) by mouth daily. 02/25/19 05/26/19  Chase Picket, MD  predniSONE (DELTASONE) 20 MG tablet Take 1 tablet (20 mg total) by mouth daily for 5 days. 02/25/19 03/02/19  Chase Picket, MD  cetirizine (ZYRTEC) 10 MG tablet Take 1 tablet (10 mg total) by mouth daily as needed for allergies. 10/21/18 02/25/19  Malachy Mood, MD    Family History Family History  Problem Relation Age of Onset  . Breast cancer Paternal Aunt 84  . Lung cancer Mother   . Alcoholism Father   . Hypertension Father     Social History Social History   Tobacco Use  . Smoking status: Never Smoker  . Smokeless tobacco: Never Used  Substance Use Topics  . Alcohol use: No  . Drug use: No     Allergies   Latex, Neosporin [neomycin-bacitracin zn-polymyx], and Peanut-containing drug products   Review of Systems Review of Systems  Constitutional: Negative for  activity change, diaphoresis, fatigue and fever.  HENT: Negative.   Eyes: Negative for pain and redness.  Respiratory: Negative for chest tightness, shortness of breath and wheezing.   Genitourinary: Negative for dysuria, frequency and urgency.  Skin: Positive for rash. Negative for color change and wound.  Neurological: Negative for dizziness, numbness and headaches.     Physical Exam Triage Vital Signs ED Triage Vitals  Enc Vitals Group     BP 02/25/19 1530 128/67     Pulse Rate 02/25/19 1530 81     Resp 02/25/19 1530 16     Temp 02/25/19 1530 98.4 F (36.9 C)     Temp Source 02/25/19 1530 Oral     SpO2 02/25/19 1530 100 %     Weight 02/25/19 1528 157 lb (71.2 kg)     Height 02/25/19 1528 5\' 4"  (1.626 m)     Head Circumference --      Peak Flow --      Pain Score 02/25/19 1528 0     Pain Loc --      Pain Edu? --       Excl. in [place]? --    No data found.  Updated Vital Signs BP 128/67 (BP Location: Left Arm)   Pulse 81   Temp 98.4 F (36.9 C) (Oral)   Resp 16   Ht 5\' 4"  (1.626 m)   Wt 71.2 kg   SpO2 100%   BMI 26.95 kg/m   Visual Acuity Right Eye Distance:   Left Eye Distance:   Bilateral Distance:    Right Eye Near:   Left Eye Near:    Bilateral Near:     Physical Exam Vitals and nursing note reviewed.  Constitutional:      General: She is in acute distress.     Appearance: Normal appearance. She is not ill-appearing.  HENT:     Right Ear: Tympanic membrane normal.     Left Ear: Tympanic membrane normal.     Mouth/Throat:     Mouth: Mucous membranes are moist.     Pharynx: No oropharyngeal exudate or posterior oropharyngeal erythema.  Cardiovascular:     Rate and Rhythm: Normal rate and regular rhythm.  Pulmonary:     Effort: Pulmonary effort is normal.     Breath sounds: Normal breath sounds. No wheezing or rhonchi.  Abdominal:     General: Bowel sounds are normal.     Palpations: Abdomen is soft.  Musculoskeletal:        General: No swelling or signs of injury. Normal range of motion.  Skin:    Capillary Refill: Capillary refill takes less than 2 seconds.     Findings: Rash present. No bruising or erythema.     Comments: Generalized papular rash without significant erythema.  No ulcerations.  Patient's face hands and feet are spared.  Neurological:     Mental Status: She is alert.      UC Treatments / Results  Labs (all labs ordered are listed, but only abnormal results are displayed) Labs Reviewed - No data to display  EKG   Radiology No results found.  Procedures Procedures (including critical care time)  Medications Ordered in UC Medications - No data to display  Initial Impression / Assessment and Plan / UC Course  I have reviewed the triage vital signs and the nursing notes.  Pertinent labs & imaging results that were available during my care of the  patient were reviewed by me and considered in my  medical decision making (see chart for details).     1.  Allergic reaction to cosmetics: Prednisone 20 mg orally daily for 5 days Hydroxyzine 25 mg every 6 hours as needed for itching for 5 days Refill Claritin 10 mg daily Refill albuterol inhaler to be used as needed If patient's symptoms worsen she is advised to return to urgent care to be reevaluated.  Final Clinical Impressions(s) / UC Diagnoses   Final diagnoses:  Irritant contact dermatitis due to cosmetics   Discharge Instructions   None    ED Prescriptions    Medication Sig Dispense Auth. Provider   albuterol (VENTOLIN HFA) 108 (90 Base) MCG/ACT inhaler Inhale 1 puff into the lungs every 6 (six) hours as needed for wheezing or shortness of breath. 18 g Chase Picket, MD   loratadine (CLARITIN) 10 MG tablet Take 1 tablet (10 mg total) by mouth daily. 90 tablet Witney Huie, Myrene Galas, MD   predniSONE (DELTASONE) 20 MG tablet Take 1 tablet (20 mg total) by mouth daily for 5 days. 5 tablet Promiss Labarbera, Myrene Galas, MD   hydrOXYzine (ATARAX/VISTARIL) 25 MG tablet Take 1 tablet (25 mg total) by mouth every 6 (six) hours for 5 days. 20 tablet Kelty Szafran, Myrene Galas, MD     PDMP not reviewed this encounter.   Chase Picket, MD 02/27/19 1048

## 2019-02-25 NOTE — ED Triage Notes (Signed)
Patient states that she has been having a rash that started 4 days ago. States that she used some downy fabric softner sheets and she thinks this may have caused the rash. Reports that she has the rash from of bottom of her feet to her head. Patient states that this is very itchy.

## 2019-03-17 ENCOUNTER — Other Ambulatory Visit: Payer: Self-pay

## 2019-03-17 ENCOUNTER — Ambulatory Visit
Admission: EM | Admit: 2019-03-17 | Discharge: 2019-03-17 | Disposition: A | Discharge status: Home / Self Care | Payer: 59 | Attending: Urgent Care | Admitting: Urgent Care

## 2019-03-17 DIAGNOSIS — L209 Atopic dermatitis, unspecified: Secondary | ICD-10-CM

## 2019-03-17 HISTORY — DX: Dermatitis, unspecified: L30.9

## 2019-03-17 MED ORDER — TRIAMCINOLONE ACETONIDE 0.1 % EX CREA: 1.0000 "application " | TOPICAL_CREAM | Freq: Two times a day (BID) | CUTANEOUS | 0 refills | Status: AC | PRN

## 2019-03-17 NOTE — Discharge Instructions (Addendum)
It was very nice seeing you today in clinic. Thank you for entrusting me with your care.   Keep skin MOISTURIZED. Use cream TWICE a day until resolved. May take oral Benadryl as needed for itching. It is in the same drug class as the one that you took before. It will also make you sleepy though. Call clinic if not improving.   Make arrangements to follow up with your regular doctor in 1 week for re-evaluation if not improving. If your symptoms/condition worsens, please seek follow up care either here or in the ER. Please remember, our [facility] providers are "right here with you" when you need [place].   Again, it was my pleasure to take care of you today. Thank you for choosing our clinic. I hope that you start to feel better quickly.   Honor Loh, MSN, APRN, FNP-C, CEN Advanced Practice Provider [facility]

## 2019-03-17 NOTE — ED Provider Notes (Signed)
[place], [place]   Name: Beth Tucker DOB: [birth year] MRN: [id number] CSN: [id number] PCP: Cletis Athens, MD  Arrival date and time:  03/17/19 1549  Chief Complaint:  Pruritis and Rash   NOTE: Prior to seeing the patient today, I have reviewed the triage nursing documentation and vital signs. Clinical staff has updated patient's PMH/PSHx, current medication list, and drug allergies/intolerances to ensure comprehensive history available to assist in medical decision making.   History:   HPI: Beth Tucker is a 64 y.o. female who presents today with complaints of rash to her neck, arms, anterior torso, and lower extremities that initially declared approximately 3 weeks ago. Rash started on her armsand has progressively spread. Patient was seen here on 02/25/2019 by Lanny Cramp,, MD; notes reviewed. Patient was treated for irritant dermatitis secondary to change in laundry product. She was treated with oral steroids, antihistamines, and hydroxyzine. Patient presents today advising that the symptoms subsided while she was on the medication, but promptly returned once medications completed. Patient noting that the rash has spread to the aforementioned areas. PMH (+) for eczema; rash reported to be worse in flexural areas/folds of neck, antecubital fossae, and popliteal areas. Rash is erythematous, indurated, and pruritic consistent with previous flares of her eczema. She has not appreciated any drainage associated with the rash. There is no facial involvement; no rash to periorbital, paranasal, or perioral areas. Patient denies that she is not experiencing any shortness of breath or sensation of pharyngeal/laryngeal fullness. In efforts to help reduce/relieve the associated pruritis, the patient advising that she has used the prescribed hydroxyzine at home, which "did not help" and caused her to be overly somnolent. Patient does not wish to continue taking this medication.   Past Medical History:    Diagnosis Date  . Fibroids   . Medical history non-contributory     Past Surgical History:  Procedure Laterality Date  . ABDOMINAL HYSTERECTOMY     pt still has ovaries and cervix  . COLONOSCOPY WITH PROPOFOL N/A 10/22/2017   Procedure: COLONOSCOPY WITH PROPOFOL;  Surgeon: [name], MD;  Location: [place];  Service: Endoscopy;  Laterality: N/A;    Family History  Problem Relation Age of Onset  . Breast cancer Paternal Aunt 90  . Lung cancer Mother   . Alcoholism Father   . Hypertension Father     Social History   Tobacco Use  . Smoking status: Never Smoker  . Smokeless tobacco: Never Used  Substance Use Topics  . Alcohol use: No  . Drug use: No    Patient Active Problem List   Diagnosis Date Noted  . Encounter for screening colonoscopy     Home Medications:    No outpatient medications have been marked as taking for the 03/17/19 encounter [facility] Encounter).    Allergies:   Latex, Neosporin [neomycin-bacitracin zn-polymyx], and Peanut-containing drug products  Review of Systems (ROS): Review of Systems  Constitutional: Negative for chills and fever.  Respiratory: Negative for cough and shortness of breath.   Cardiovascular: Negative for chest pain and palpitations.  Skin: Positive for rash. Negative for color change and pallor.  All other systems reviewed and are negative.    Vital Signs: Today's Vitals   03/17/19 1659 03/17/19 1700 03/17/19 1740  BP:  119/69   Pulse:  80   Resp:  16   Temp:  98.3 F (36.8 C)   TempSrc:  Oral   SpO2:  100%   Weight: 156 lb 15.5 oz (71.2 kg)  Height: 5\' 4"  (1.626 m)    PainSc: 0-No pain  0-No pain    Physical Exam: Physical Exam  Constitutional: She is oriented to person, place, and time and well-developed, well-nourished, and in no distress.  HENT:  Head: Normocephalic and atraumatic.  Eyes: Pupils are equal, round, and reactive to light.  Cardiovascular: Normal rate.  Pulmonary/Chest:  Effort normal. No respiratory distress.  Neurological: She is alert and oriented to person, place, and time. Gait normal.  Skin: Skin is warm and dry. Rash noted. She is not diaphoretic.  Eczematous rash to neck, BUE/BLE, and anterior torso. No drainage. Significantly pruritic. Rash worse in flexural areas.  Psychiatric: Mood, memory, affect and judgment normal.  Nursing note and vitals reviewed.   Urgent Care Treatments / Results:   No orders of the defined types were placed in this encounter.   LABS: PLEASE NOTE: all labs that were ordered this encounter are listed, however only abnormal results are displayed. Labs Reviewed - No data to display  EKG: -None  RADIOLOGY: No results found.  PROCEDURES: Procedures  MEDICATIONS RECEIVED THIS VISIT: Medications - No data to display  PERTINENT CLINICAL COURSE NOTES/UPDATES:   Initial Impression / Assessment and Plan / Urgent Care Course:  Pertinent labs & imaging results that were available during my care of the patient were personally reviewed by me and considered in my medical decision making (see lab/imaging section of note for values and interpretations).  Beth Tucker is a 64 y.o. female who presents to [facility] today with complaints of Pruritis and Rash  Patient is well appearing overall in clinic today. She does not appear to be in any acute distress. Presenting symptoms (see HPI) and exam as documented above. Rash recurrent following treatment with systemic steroids and antihistamines. Initially diagnosed as irritant dermatitis following flare after change in laundry agents. In review of her PMH, patient has a history of eczema and notes that this is similar to previous flares. Given that rash is worse in the flexural areas of her body (neck folds, AC, popliteal area), I would agree that this is indeed more consistent with an eczema flare as opposed to a contact dermatitis. Will treat with topical TAC 0.1% BID until  resolved. Patient does not wish to continue hydroxyzine citing increased somnolence. Itching worse at night. Patient may use oral diphenhydramine as needed, however she was made aware that it too will cause somnolence. Encouraged patient to continue prescribed loratadine daily. Reviewed eczema and need to ensure that skin is frequently moisturized with good quality lotion to prevent further exacerbations. Patient to call the clinic if not improving.     Discussed follow up with primary care physician in 1 week for re-evaluation. I have reviewed the follow up and strict return precautions for any new or worsening symptoms. Patient is aware of symptoms that would be deemed urgent/emergent, and would thus require further evaluation either here or in the emergency department. At the time of discharge, she verbalized understanding and consent with the discharge plan as it was reviewed with her. All questions were fielded by provider and/or clinic staff prior to patient discharge.    Final Clinical Impressions / Urgent Care Diagnoses:   Final diagnoses:  Atopic dermatitis, unspecified type    New Prescriptions:  [place] Controlled Substance Registry consulted? Not Applicable  Meds ordered this encounter  Medications  . triamcinolone cream (KENALOG) 0.1 %    Sig: Apply 1 application topically 2 (two) times daily as needed.  Dispense:  453.6 g    Refill:  0    Recommended Follow up Care:  Patient encouraged to follow up with the following provider within the specified time frame, or sooner as dictated by the severity of her symptoms. As always, she was instructed that for any urgent/emergent care needs, she should seek care either here or in the emergency department for more immediate evaluation.  Follow-up Information    Cletis Athens, MD In 1 week.   Specialty: Internal Medicine Why: General reassessment of symptoms if not improving Contact information: [place]  [id number] [phone]         NOTE: This note was prepared using Dragon dictation software along with smaller phrase technology. Despite my best ability to proofread, there is the potential that transcriptional errors may still occur from this process, and are completely unintentional.    Karen Kitchens, NP 03/19/19 0025

## 2019-03-17 NOTE — ED Triage Notes (Signed)
Pt was treated here for rash on 12/29. Finished meds and rash/itching has gotten much worse and spread all over.

## 2019-03-19 ENCOUNTER — Encounter: Payer: Self-pay | Admitting: Urgent Care

## 2019-09-23 ENCOUNTER — Other Ambulatory Visit: Payer: Self-pay

## 2019-09-23 ENCOUNTER — Ambulatory Visit (LOCAL_COMMUNITY_HEALTH_CENTER): Payer: Self-pay

## 2019-09-23 DIAGNOSIS — Z111 Encounter for screening for respiratory tuberculosis: Secondary | ICD-10-CM

## 2019-09-26 ENCOUNTER — Ambulatory Visit (LOCAL_COMMUNITY_HEALTH_CENTER): Payer: BLUE CROSS/BLUE SHIELD

## 2019-09-26 ENCOUNTER — Other Ambulatory Visit: Payer: Self-pay

## 2019-09-26 DIAGNOSIS — Z111 Encounter for screening for respiratory tuberculosis: Secondary | ICD-10-CM

## 2019-09-26 LAB — TB SKIN TEST
Induration: 0 mm
TB Skin Test: NEGATIVE

## 2020-04-30 ENCOUNTER — Other Ambulatory Visit: Payer: Self-pay

## 2020-04-30 ENCOUNTER — Ambulatory Visit
Admission: RE | Admit: 2020-04-30 | Discharge: 2020-04-30 | Disposition: A | Discharge status: Home / Self Care | Payer: 59 | Source: Ambulatory Visit | Attending: Cardiology | Admitting: Cardiology

## 2020-04-30 ENCOUNTER — Other Ambulatory Visit: Payer: Self-pay | Admitting: Cardiology

## 2020-04-30 DIAGNOSIS — Z1231 Encounter for screening mammogram for malignant neoplasm of breast: Secondary | ICD-10-CM

## 2020-05-05 ENCOUNTER — Ambulatory Visit
Admission: EM | Admit: 2020-05-05 | Discharge: 2020-05-05 | Disposition: A | Discharge status: Home / Self Care | Payer: BC Managed Care – PPO | Attending: Emergency Medicine | Admitting: Emergency Medicine

## 2020-05-05 ENCOUNTER — Other Ambulatory Visit: Payer: Self-pay

## 2020-05-05 DIAGNOSIS — Z760 Encounter for issue of repeat prescription: Secondary | ICD-10-CM | POA: Diagnosis not present

## 2020-05-05 DIAGNOSIS — Z113 Encounter for screening for infections with a predominantly sexual mode of transmission: Secondary | ICD-10-CM | POA: Diagnosis not present

## 2020-05-05 DIAGNOSIS — N760 Acute vaginitis: Secondary | ICD-10-CM | POA: Diagnosis not present

## 2020-05-05 DIAGNOSIS — N898 Other specified noninflammatory disorders of vagina: Secondary | ICD-10-CM | POA: Diagnosis not present

## 2020-05-05 DIAGNOSIS — R519 Headache, unspecified: Secondary | ICD-10-CM | POA: Diagnosis not present

## 2020-05-05 DIAGNOSIS — R3915 Urgency of urination: Secondary | ICD-10-CM | POA: Diagnosis not present

## 2020-05-05 DIAGNOSIS — R0981 Nasal congestion: Secondary | ICD-10-CM | POA: Diagnosis not present

## 2020-05-05 DIAGNOSIS — Z20822 Contact with and (suspected) exposure to covid-19: Secondary | ICD-10-CM | POA: Diagnosis not present

## 2020-05-05 DIAGNOSIS — B9689 Other specified bacterial agents as the cause of diseases classified elsewhere: Secondary | ICD-10-CM | POA: Insufficient documentation

## 2020-05-05 LAB — URINALYSIS, COMPLETE (UACMP) WITH MICROSCOPIC
Bilirubin Urine: NEGATIVE
Glucose, UA: NEGATIVE mg/dL
Ketones, ur: NEGATIVE mg/dL
Leukocytes,Ua: NEGATIVE
Nitrite: NEGATIVE
Protein, ur: NEGATIVE mg/dL
Specific Gravity, Urine: 1.02 (ref 1.005–1.030)
pH: 5.5 (ref 5.0–8.0)

## 2020-05-05 LAB — WET PREP, GENITAL
Sperm: NONE SEEN
Trich, Wet Prep: NONE SEEN
Yeast Wet Prep HPF POC: NONE SEEN

## 2020-05-05 LAB — CHLAMYDIA/NGC RT PCR (ARMC ONLY)
Chlamydia Tr: NOT DETECTED
N gonorrhoeae: NOT DETECTED

## 2020-05-05 MED ORDER — ALBUTEROL SULFATE HFA 108 (90 BASE) MCG/ACT IN AERS: 1.0000 | INHALATION_SPRAY | Freq: Four times a day (QID) | RESPIRATORY_TRACT | 1 refills | Status: AC | PRN

## 2020-05-05 MED ORDER — LORATADINE 10 MG PO TABS: 10.0000 mg | ORAL_TABLET | Freq: Every day | ORAL | 1 refills | Status: DC

## 2020-05-05 MED ORDER — LORATADINE 10 MG PO TABS: 10.0000 mg | ORAL_TABLET | Freq: Every day | ORAL | 1 refills | Status: AC

## 2020-05-05 MED ORDER — ALBUTEROL SULFATE HFA 108 (90 BASE) MCG/ACT IN AERS: 1.0000 | INHALATION_SPRAY | Freq: Four times a day (QID) | RESPIRATORY_TRACT | 1 refills | Status: DC | PRN

## 2020-05-05 MED ORDER — METRONIDAZOLE 0.75 % VA GEL: 1.0000 | Freq: Every day | VAGINAL | 0 refills | Status: AC

## 2020-05-05 NOTE — ED Provider Notes (Signed)
[facility]    CSN: [id number] Arrival date & time: 05/05/20  1222      History   Chief Complaint Chief Complaint  Patient presents with  . Headache    HPI Beth Tucker is a 65 y.o. female presenting with multiple complaints.  First she states that she has had headaches over her sinuses and frontal head with associated nasal congestion and postnasal drainage.  Symptoms ongoing x2 days.  She says she has felt febrile but not recorded temperature.  Patient does report that her employer is concerned for possible COVID-19 given her symptoms.  Patient denies any known Covid exposure and is fully vaccinated for COVID-19.  Patient states that she does have allergies and "bad sinuses."  She says she only takes Claritin and uses albuterol when she needs it but is out of these medications and would like a refill.  Patient says that her headache is significant, but not the worst and that she has had and is typical of sinus headache.  She denies any associated vision changes, dizziness, nausea/vomiting, confusion, falls or lethargy.  Additionally she admits to 3-day history of vaginal itching and discomfort along with urinary urgency.  Patient would like STI testing.  She denies any pelvic pain.  Has had some mild vaginal discharge.  No abnormal bleeding.  No other concerns.  HPI  Past Medical History:  Diagnosis Date  . Eczema   . Fibroids     Patient Active Problem List   Diagnosis Date Noted  . Encounter for screening colonoscopy     Past Surgical History:  Procedure Laterality Date  . ABDOMINAL HYSTERECTOMY     pt still has ovaries and cervix  . COLONOSCOPY WITH PROPOFOL N/A 10/22/2017   Procedure: COLONOSCOPY WITH PROPOFOL;  Surgeon: [name], MD;  Location: [place];  Service: Endoscopy;  Laterality: N/A;    OB History    Gravida  4   Para  3   Term  3   Preterm      AB  1   Living  3     SAB      IAB      Ectopic      Multiple       Live Births  3            Home Medications    Prior to Admission medications   Medication Sig Start Date End Date Taking? Authorizing Provider  metroNIDAZOLE (METROGEL VAGINAL) 0.75 % vaginal gel Place 1 Applicatorful vaginally at bedtime for 5 days. 05/05/20 05/10/20 Yes Danton Clap, PA-C  albuterol (VENTOLIN HFA) 108 (90 Base) MCG/ACT inhaler Inhale 1 puff into the lungs every 6 (six) hours as needed for wheezing or shortness of breath. 05/05/20 05/05/21  Laurene Footman B, PA-C  IRON PO Take by mouth daily.    [provider]  loratadine (CLARITIN) 10 MG tablet Take 1 tablet (10 mg total) by mouth daily. 05/05/20 08/03/20  Laurene Footman B, PA-C  triamcinolone cream (KENALOG) 0.1 % Apply 1 application topically 2 (two) times daily as needed. 03/17/19   Karen Kitchens, NP  cetirizine (ZYRTEC) 10 MG tablet Take 1 tablet (10 mg total) by mouth daily as needed for allergies. 10/21/18 02/25/19  Malachy Mood, MD  fluticasone (FLONASE) 50 MCG/ACT nasal spray Place 2 sprays into both nostrils daily. 10/21/18 03/17/19  Malachy Mood, MD    Family History Family History  Problem Relation Age of Onset  . Breast cancer Paternal  Aunt 75  . Lung cancer Mother   . Alcoholism Father   . Hypertension Father     Social History Social History   Tobacco Use  . Smoking status: Never Smoker  . Smokeless tobacco: Never Used  Vaping Use  . Vaping Use: Never used  Substance Use Topics  . Alcohol use: No  . Drug use: No     Allergies   Wasp venom, Latex, Neosporin [neomycin-bacitracin zn-polymyx], and Peanut-containing drug products   Review of Systems Review of Systems  Constitutional: Negative for chills, diaphoresis, fatigue and fever.  HENT: Positive for congestion, rhinorrhea, sinus pressure and sinus pain. Negative for ear pain and sore throat.   Eyes: Negative for photophobia and visual disturbance.  Respiratory: Negative for cough and shortness of breath.   Cardiovascular:  Negative for chest pain.  Gastrointestinal: Negative for abdominal pain, nausea and vomiting.  Genitourinary: Positive for frequency and vaginal discharge. Negative for dysuria.  Musculoskeletal: Negative for arthralgias and myalgias.  Skin: Negative for rash.  Neurological: Positive for headaches. Negative for dizziness, syncope, weakness and numbness.  Hematological: Negative for adenopathy.     Physical Exam Triage Vital Signs ED Triage Vitals  Enc Vitals Group     BP 05/05/20 1322 126/70     Pulse Rate 05/05/20 1322 70     Resp 05/05/20 1322 16     Temp 05/05/20 1322 98.2 F (36.8 C)     Temp Source 05/05/20 1322 Oral     SpO2 05/05/20 1322 98 %     Weight 05/05/20 1323 154 lb (69.9 kg)     Height 05/05/20 1323 5\' 4"  (1.626 m)     Head Circumference --      Peak Flow --      Pain Score 05/05/20 1323 10     Pain Loc --      Pain Edu? --      Excl. in [place]? --    No data found.  Updated Vital Signs BP 126/70   Pulse 70   Temp 98.2 F (36.8 C) (Oral)   Resp 16   Ht 5\' 4"  (1.626 m)   Wt 154 lb (69.9 kg)   SpO2 98%   BMI 26.43 kg/m       Physical Exam Vitals and nursing note reviewed.  Constitutional:      General: She is not in acute distress.    Appearance: Normal appearance. She is not ill-appearing or toxic-appearing.  HENT:     Head: Normocephalic and atraumatic.     Nose: Congestion present. No rhinorrhea.     Mouth/Throat:     Mouth: Mucous membranes are moist.     Pharynx: Oropharynx is clear. No posterior oropharyngeal erythema.  Eyes:     General: No scleral icterus.       Right eye: No discharge.        Left eye: No discharge.     Conjunctiva/sclera: Conjunctivae normal.  Cardiovascular:     Rate and Rhythm: Normal rate and regular rhythm.     Heart sounds: Normal heart sounds.  Pulmonary:     Effort: Pulmonary effort is normal. No respiratory distress.     Breath sounds: Normal breath sounds.  Abdominal:     Palpations: Abdomen is soft.      Tenderness: There is no abdominal tenderness.  Musculoskeletal:     Cervical back: Neck supple.  Skin:    General: Skin is dry.  Neurological:     General: No focal  deficit present.     Mental Status: She is alert. Mental status is at baseline.     Motor: No weakness.     Gait: Gait normal.  Psychiatric:        Mood and Affect: Mood normal.        Behavior: Behavior normal.        Thought Content: Thought content normal.      UC Treatments / Results  Labs (all labs ordered are listed, but only abnormal results are displayed) Labs Reviewed  WET PREP, GENITAL - Abnormal; Notable for the following components:      Result Value   Clue Cells Wet Prep HPF POC PRESENT (*)    WBC, Wet Prep HPF POC MODERATE (*)    All other components within normal limits  CHLAMYDIA/NGC RT PCR ([facility] ONLY)  SARS CORONAVIRUS 2 (TAT 6-24 HRS)  URINALYSIS, COMPLETE (UACMP) WITH MICROSCOPIC    EKG   Radiology No results found.  Procedures Procedures (including critical care time)  Medications Ordered in UC Medications - No data to display  Initial Impression / Assessment and Plan / UC Course  I have reviewed the triage vital signs and the nursing notes.  Pertinent labs & imaging results that were available during my care of the patient were reviewed by me and considered in my medical decision making (see chart for details).   1.  Headache/congestion: Symptoms most consistent with sinus headache since it is should be throughout the frontal and maxillary region bilaterally.  I have refilled her Claritin albuterol as she asked.  Also advised her to start Flonase and increase rest and fluids.  Covid test was obtained.  Current CDC guidelines, isolation protocol and ED precautions reviewed with patient.  Work note provided.  Advised her she can go back if test is negative asked about 5 days if positive and again, reviewed CDC guidelines.  ED precautions for headache also reviewed  patient.  2.  Vaginal itching and discharge: Wet prep positive for clue cells.  Treating BV with MetroGel.  STI testing also obtained.  Advised patient on access results.  Advised that if she is positive she will need treatment.  3.  Urinary urgency: UA obtained today.  Trace blood.  No sign of UTI.  Patient suggested to recheck UA through PCP since there was some trace blood.  Advised increased rest and fluids at this time.  Follow-up for any pain that develops.   Final Clinical Impressions(s) / UC Diagnoses   Final diagnoses:  Sinus headache  Nasal congestion  Medication refill  Vaginal itching  Bacterial vaginosis  Screening examination for sexually transmitted disease  Urinary urgency     Discharge Instructions     Your headache is likely related to your allergies.  It sounds like a sinus headache.  I have refilled her Claritin.  Also refilled your albuterol inhaler.  Consider using Flonase.  You can also take ibuprofen or Tylenol for headache.  Increase rest and fluids.   Vaginal swab shows you have BV infection.  I sent MetroGel to pharmacy.  Make sure to use the full course.  Use pH balance washes and wipes.  Your STI testing will be available later today.  I will be accessible through [place].  We will call if something is positive and you need treatment.  You have received COVID testing today either for positive exposure, concerning symptoms that could be related to COVID infection, screening purposes, or re-testing after confirmed positive.  Your test  obtained today checks for active viral infection in the last 1-2 weeks. If your test is negative now, you can still test positive later. So, if you do develop symptoms you should either get re-tested and/or isolate x 5 days and then strict mask use x 5 days (unvaccinated) or mask use x 10 days (vaccinated). Please follow CDC guidelines.  While Rapid antigen tests come back in 15-20 minutes, send out PCR/molecular test results  typically come back within 1-3 days. In the mean time, if you are symptomatic, assume this could be a positive test and treat/monitor yourself as if you do have COVID.   We will call with test results if positive. Please download the MyChart app and set up a profile to access test results.   If symptomatic, go home and rest. Push fluids. Take Tylenol as needed for discomfort. Gargle warm salt water. Throat lozenges. Take Mucinex DM or Robitussin for cough. Humidifier in bedroom to ease coughing. Warm showers. Also review the COVID handout for more information.  COVID-19 INFECTION: The incubation period of COVID-19 is approximately 14 days after exposure, with most symptoms developing in roughly 4-5 days. Symptoms may range in severity from mild to critically severe. Roughly 80% of those infected will have mild symptoms. People of any age may become infected with COVID-19 and have the ability to transmit the virus. The most common symptoms include: fever, fatigue, cough, body aches, headaches, sore throat, nasal congestion, shortness of breath, nausea, vomiting, diarrhea, changes in smell and/or taste.    COURSE OF ILLNESS Some patients may begin with mild disease which can progress quickly into critical symptoms. If your symptoms are worsening please call ahead to the Emergency Department and proceed there for further treatment. Recovery time appears to be roughly 1-2 weeks for mild symptoms and 3-6 weeks for severe disease.   GO IMMEDIATELY TO ER FOR FEVER YOU ARE UNABLE TO GET DOWN WITH TYLENOL, BREATHING PROBLEMS, CHEST PAIN, FATIGUE, LETHARGY, INABILITY TO EAT OR DRINK, ETC  QUARANTINE AND ISOLATION: To help decrease the spread of COVID-19 please remain isolated if you have COVID infection or are highly suspected to have COVID infection. This means -stay home and isolate to one room in the home if you live with others. Do not share a bed or bathroom with others while ill, sanitize and wipe down all  countertops and keep common areas clean and disinfected. Stay home for 5 days. If you have no symptoms or your symptoms are resolving after 5 days, you can leave your house. Continue to wear a mask around others for 5 additional days. If you have been in close contact (within 6 feet) of someone diagnosed with COVID 19, you are advised to quarantine in your home for 14 days as symptoms can develop anywhere from 2-14 days after exposure to the virus. If you develop symptoms, you  must isolate.  Most current guidelines for COVID after exposure -unvaccinated: isolate 5 days and strict mask use x 5 days. Test on day 5 is possible -vaccinated: wear mask x 10 days if symptoms do not develop -You do not necessarily need to be tested for COVID if you have + exposure and  develop symptoms. Just isolate at home x10 days from symptom onset During this global pandemic, CDC advises to practice social distancing, try to stay at least 77ft away from others at all times. Wear a face covering. Wash and sanitize your hands regularly and avoid going anywhere that is not necessary.  KEEP IN  MIND THAT THE COVID TEST IS NOT 100% ACCURATE AND YOU SHOULD STILL DO EVERYTHING TO PREVENT POTENTIAL SPREAD OF VIRUS TO OTHERS (WEAR MASK, WEAR GLOVES, [place] HANDS AND SANITIZE REGULARLY). IF INITIAL TEST IS NEGATIVE, THIS MAY NOT MEAN YOU ARE DEFINITELY NEGATIVE. MOST ACCURATE TESTING IS DONE 5-7 DAYS AFTER EXPOSURE.   It is not advised by CDC to get re-tested after receiving a positive COVID test since you can still test positive for weeks to months after you have already cleared the virus.   *If you have not been vaccinated for COVID, I strongly suggest you consider getting vaccinated as long as there are no contraindications.      ED Prescriptions    Medication Sig Dispense Auth. Provider   albuterol (VENTOLIN HFA) 108 (90 Base) MCG/ACT inhaler  (Status: Discontinued) Inhale 1 puff into the lungs every 6 (six) hours as needed  for wheezing or shortness of breath. 18 g Laurene Footman B, PA-C   loratadine (CLARITIN) 10 MG tablet  (Status: Discontinued) Take 1 tablet (10 mg total) by mouth daily. 90 tablet Laurene Footman B, PA-C   metroNIDAZOLE (METROGEL VAGINAL) 0.75 % vaginal gel Place 1 Applicatorful vaginally at bedtime for 5 days. 50 g Laurene Footman B, PA-C   albuterol (VENTOLIN HFA) 108 (90 Base) MCG/ACT inhaler Inhale 1 puff into the lungs every 6 (six) hours as needed for wheezing or shortness of breath. 18 g Laurene Footman B, PA-C   loratadine (CLARITIN) 10 MG tablet Take 1 tablet (10 mg total) by mouth daily. 90 tablet Gretta Cool     PDMP not reviewed this encounter.   Danton Clap, PA-C 05/05/20 1502

## 2020-05-05 NOTE — ED Triage Notes (Signed)
Pt reports having a headache that began last night. Have not taken any OTC. Also sts she felt warm and thought she had a fever.   Also reports having vaginal itching x3 days.

## 2020-05-05 NOTE — Discharge Instructions (Signed)
Your headache is likely related to your allergies.  It sounds like a sinus headache.  I have refilled her Claritin.  Also refilled your albuterol inhaler.  Consider using Flonase.  You can also take ibuprofen or Tylenol for headache.  Increase rest and fluids.   Vaginal swab shows you have BV infection.  I sent MetroGel to pharmacy.  Make sure to use the full course.  Use pH balance washes and wipes.  Your STI testing will be available later today.  I will be accessible through [facility].  We will call if something is positive and you need treatment.  You have received COVID testing today either for positive exposure, concerning symptoms that could be related to COVID infection, screening purposes, or re-testing after confirmed positive.  Your test obtained today checks for active viral infection in the last 1-2 weeks. If your test is negative now, you can still test positive later. So, if you do develop symptoms you should either get re-tested and/or isolate x 5 days and then strict mask use x 5 days (unvaccinated) or mask use x 10 days (vaccinated). Please follow CDC guidelines.  While Rapid antigen tests come back in 15-20 minutes, send out PCR/molecular test results typically come back within 1-3 days. In the mean time, if you are symptomatic, assume this could be a positive test and treat/monitor yourself as if you do have COVID.   We will call with test results if positive. Please download the MyChart app and set up a profile to access test results.   If symptomatic, go home and rest. Push fluids. Take Tylenol as needed for discomfort. Gargle warm salt water. Throat lozenges. Take Mucinex DM or Robitussin for cough. Humidifier in bedroom to ease coughing. Warm showers. Also review the COVID handout for more information.  COVID-19 INFECTION: The incubation period of COVID-19 is approximately 14 days after exposure, with most symptoms developing in roughly 4-5 days. Symptoms may range in severity from  mild to critically severe. Roughly 80% of those infected will have mild symptoms. People of any age may become infected with COVID-19 and have the ability to transmit the virus. The most common symptoms include: fever, fatigue, cough, body aches, headaches, sore throat, nasal congestion, shortness of breath, nausea, vomiting, diarrhea, changes in smell and/or taste.    COURSE OF ILLNESS Some patients may begin with mild disease which can progress quickly into critical symptoms. If your symptoms are worsening please call ahead to the Emergency Department and proceed there for further treatment. Recovery time appears to be roughly 1-2 weeks for mild symptoms and 3-6 weeks for severe disease.   GO IMMEDIATELY TO ER FOR FEVER YOU ARE UNABLE TO GET DOWN WITH TYLENOL, BREATHING PROBLEMS, CHEST PAIN, FATIGUE, LETHARGY, INABILITY TO EAT OR DRINK, ETC  QUARANTINE AND ISOLATION: To help decrease the spread of COVID-19 please remain isolated if you have COVID infection or are highly suspected to have COVID infection. This means -stay home and isolate to one room in the home if you live with others. Do not share a bed or bathroom with others while ill, sanitize and wipe down all countertops and keep common areas clean and disinfected. Stay home for 5 days. If you have no symptoms or your symptoms are resolving after 5 days, you can leave your house. Continue to wear a mask around others for 5 additional days. If you have been in close contact (within 6 feet) of someone diagnosed with COVID 19, you are advised to quarantine in  your home for 14 days as symptoms can develop anywhere from 2-14 days after exposure to the virus. If you develop symptoms, you  must isolate.  Most current guidelines for COVID after exposure -unvaccinated: isolate 5 days and strict mask use x 5 days. Test on day 5 is possible -vaccinated: wear mask x 10 days if symptoms do not develop -You do not necessarily need to be tested for COVID if you  have + exposure and  develop symptoms. Just isolate at home x10 days from symptom onset During this global pandemic, CDC advises to practice social distancing, try to stay at least 13ft away from others at all times. Wear a face covering. Wash and sanitize your hands regularly and avoid going anywhere that is not necessary.  KEEP IN MIND THAT THE COVID TEST IS NOT 100% ACCURATE AND YOU SHOULD STILL DO EVERYTHING TO PREVENT POTENTIAL SPREAD OF VIRUS TO OTHERS (WEAR MASK, WEAR GLOVES, [place] HANDS AND SANITIZE REGULARLY). IF INITIAL TEST IS NEGATIVE, THIS MAY NOT MEAN YOU ARE DEFINITELY NEGATIVE. MOST ACCURATE TESTING IS DONE 5-7 DAYS AFTER EXPOSURE.   It is not advised by CDC to get re-tested after receiving a positive COVID test since you can still test positive for weeks to months after you have already cleared the virus.   *If you have not been vaccinated for COVID, I strongly suggest you consider getting vaccinated as long as there are no contraindications.

## 2020-05-06 LAB — SARS CORONAVIRUS 2 (TAT 6-24 HRS): SARS Coronavirus 2: NEGATIVE

## 2021-04-15 IMAGING — MG MM DIGITAL SCREENING BILAT W/ TOMO AND CAD
8 series · 8 of 24 positions shown · non-contrast
Comparison: Previous exam(s).

CLINICAL DATA: Screening.

EXAM:
DIGITAL SCREENING BILATERAL MAMMOGRAM WITH TOMOSYNTHESIS AND CAD
TECHNIQUE: Bilateral screening digital craniocaudal and mediolateral oblique
mammograms were obtained. Bilateral screening digital breast
tomosynthesis was performed. The images were evaluated with
computer-aided detection.

[L CC synth-2D]
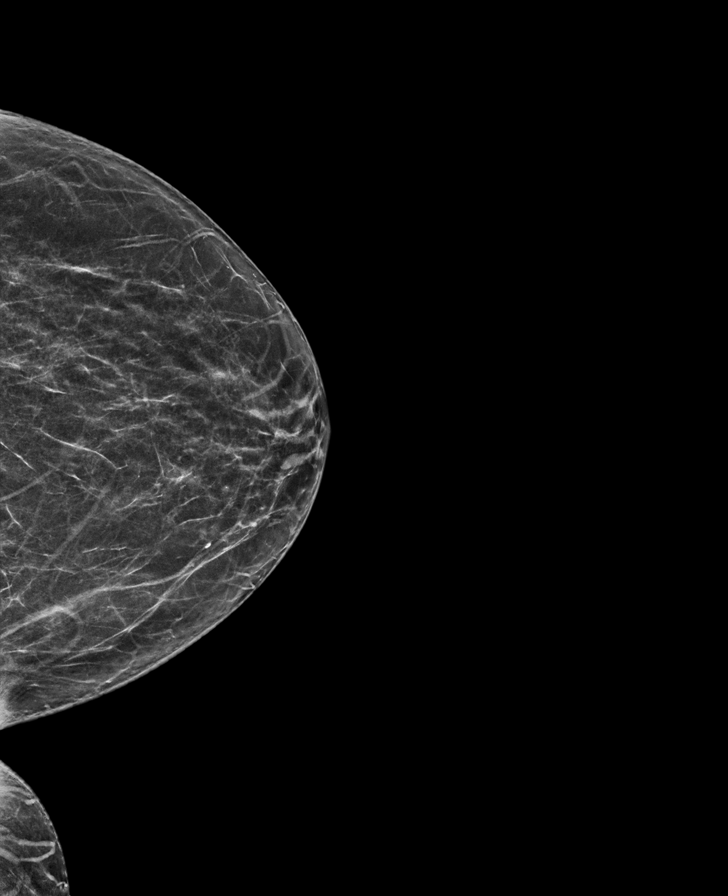

[L MLO synth-2D]
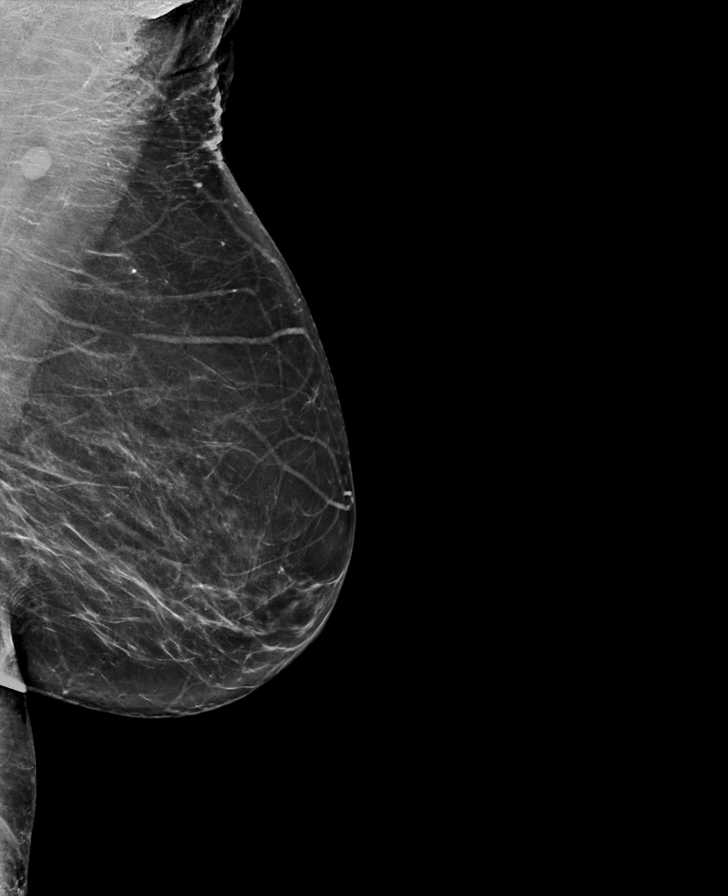

[R MLO synth-2D]
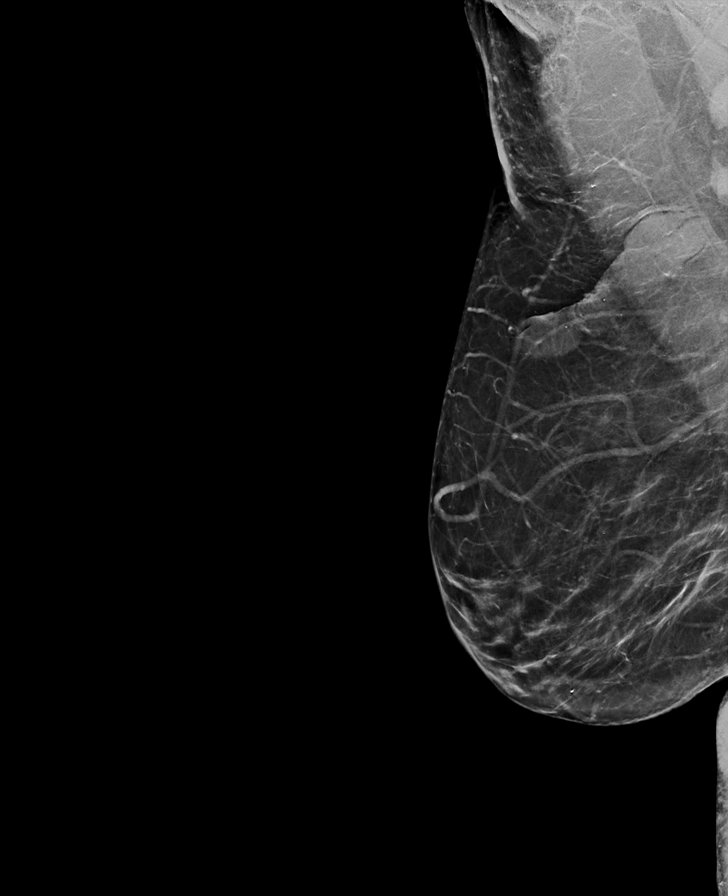

[R CC synth-2D]
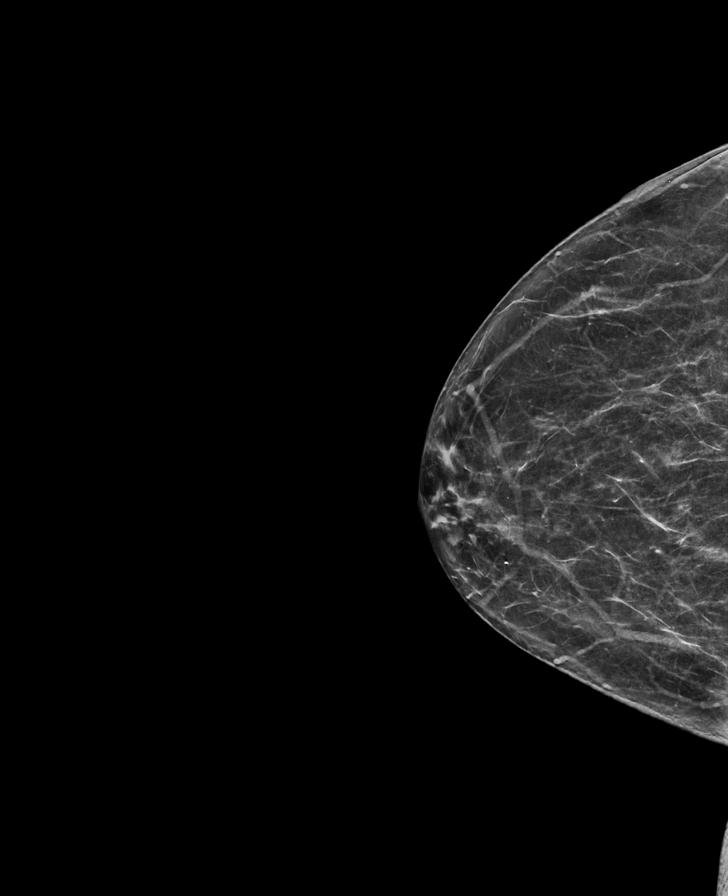

[R MLO tomo · tomo slice 37/73.0]
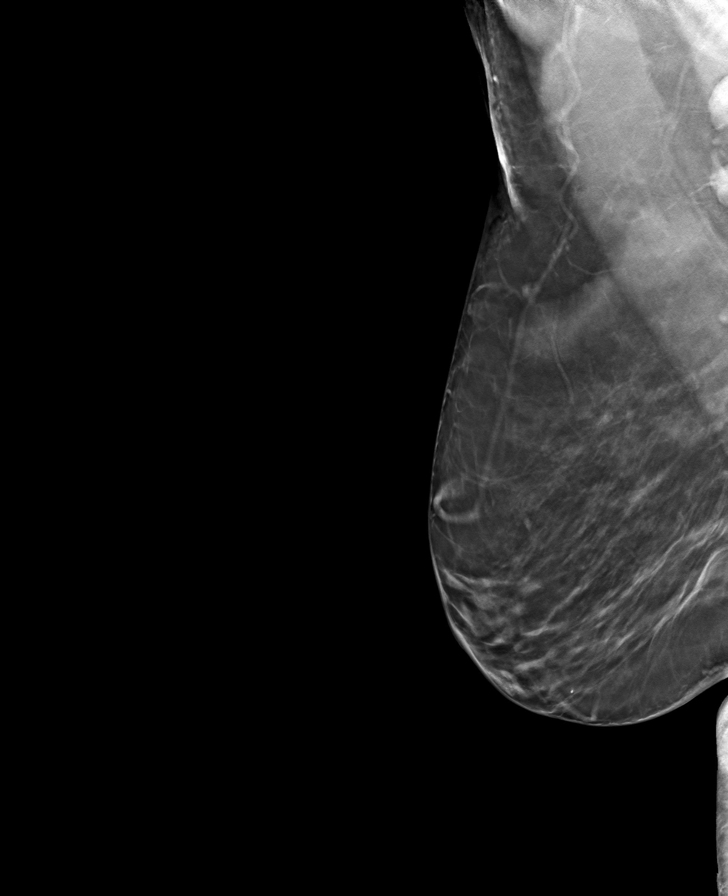

[L MLO tomo · tomo slice 37/73.0]
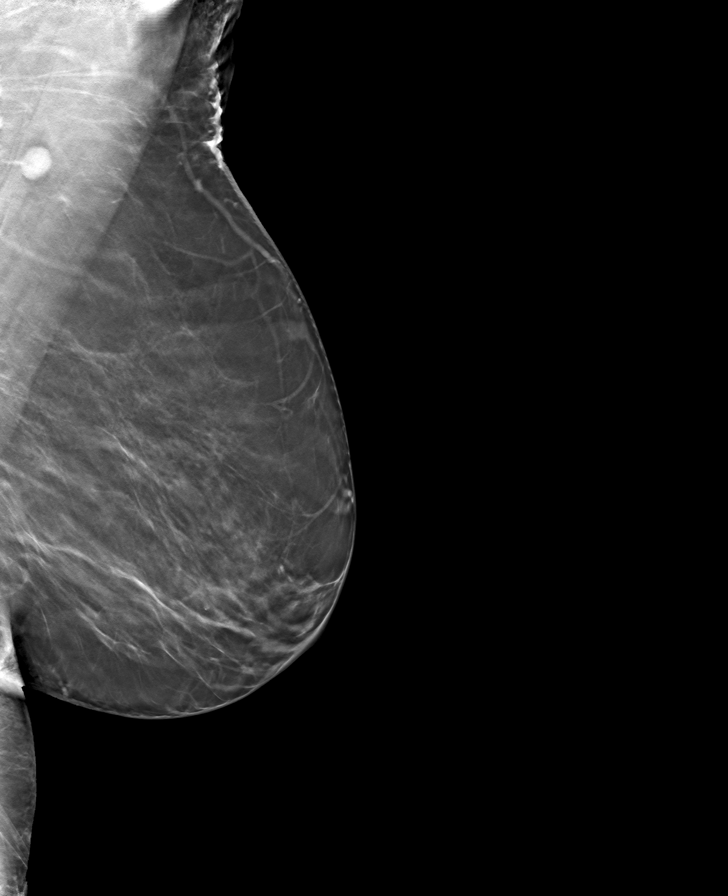

[R CC tomo · tomo slice 32/63.0]
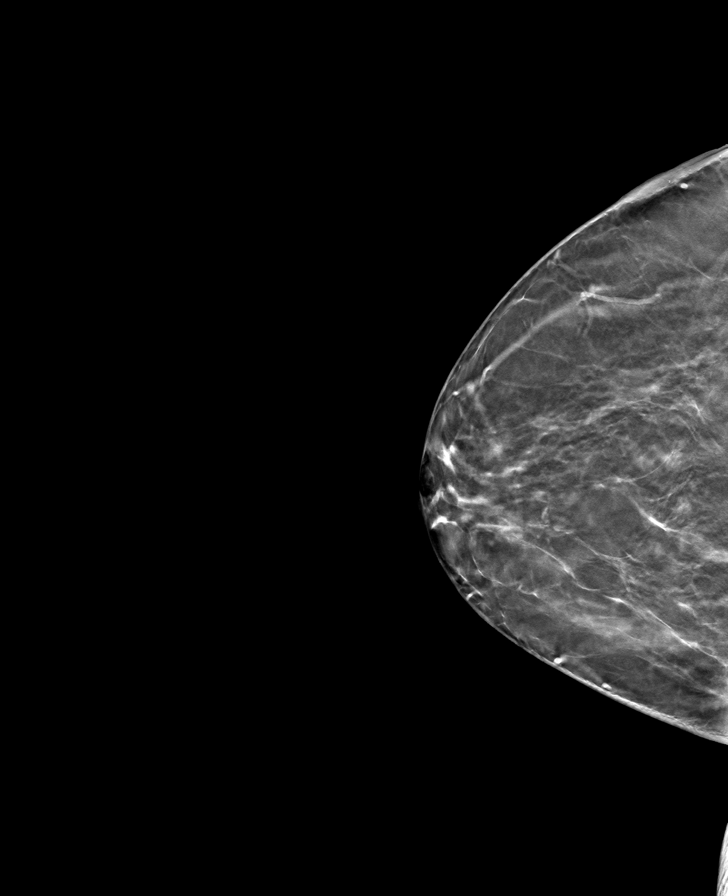

[L CC tomo · tomo slice 31/60.0]
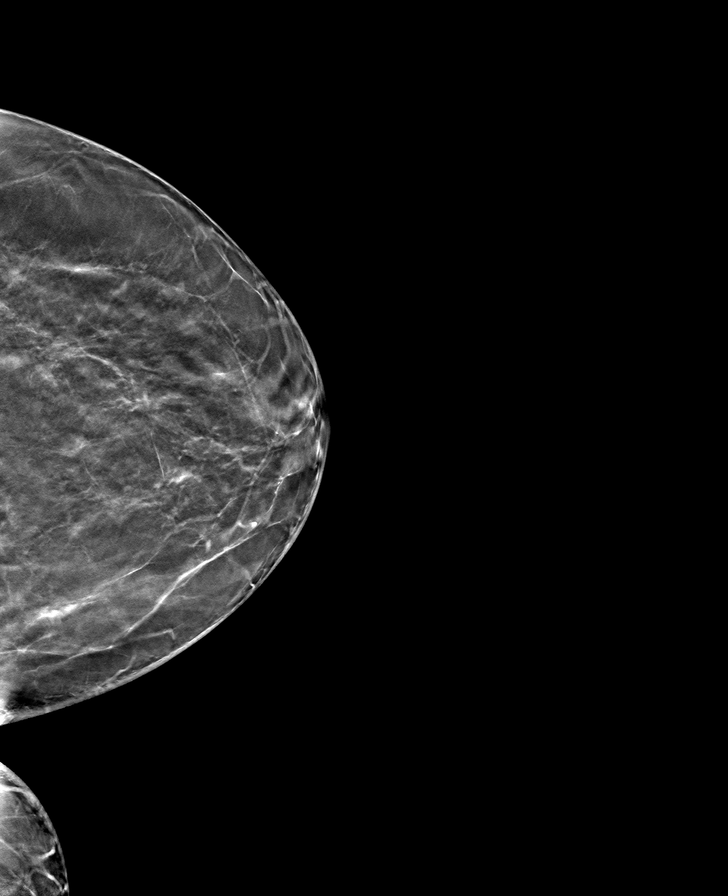

[8 of 24 positions shown; findings below may reference images not displayed]

ACR Breast Density Category b: There are scattered areas of
fibroglandular density.
FINDINGS: There are no findings suspicious for malignancy. The images were
evaluated with computer-aided detection.
IMPRESSION: No mammographic evidence of malignancy. A result letter of this
screening mammogram will be mailed directly to the patient.

RECOMMENDATION:
Screening mammogram in one year. (Code:[id number])

BI-RADS CATEGORY  1: Negative.

## 2022-06-22 ENCOUNTER — Other Ambulatory Visit: Payer: Self-pay | Admitting: Internal Medicine

## 2022-06-22 DIAGNOSIS — Z1231 Encounter for screening mammogram for malignant neoplasm of breast: Secondary | ICD-10-CM

## 2022-08-25 ENCOUNTER — Ambulatory Visit
Admission: RE | Admit: 2022-08-25 | Discharge: 2022-08-25 | Disposition: A | Discharge status: Home / Self Care | Payer: BC Managed Care – PPO | Source: Ambulatory Visit | Attending: Internal Medicine | Admitting: Internal Medicine

## 2022-08-25 DIAGNOSIS — Z1231 Encounter for screening mammogram for malignant neoplasm of breast: Secondary | ICD-10-CM | POA: Diagnosis present

## 2022-10-04 ENCOUNTER — Ambulatory Visit
Admission: EM | Admit: 2022-10-04 | Discharge: 2022-10-04 | Disposition: A | Discharge status: Home / Self Care | Payer: BC Managed Care – PPO | Attending: Physician Assistant | Admitting: Physician Assistant

## 2022-10-04 DIAGNOSIS — R051 Acute cough: Secondary | ICD-10-CM | POA: Diagnosis not present

## 2022-10-04 DIAGNOSIS — Z8709 Personal history of other diseases of the respiratory system: Secondary | ICD-10-CM | POA: Diagnosis not present

## 2022-10-04 DIAGNOSIS — U071 COVID-19: Secondary | ICD-10-CM | POA: Diagnosis not present

## 2022-10-04 HISTORY — DX: Unspecified asthma, uncomplicated: J45.909

## 2022-10-04 LAB — SARS CORONAVIRUS 2 BY RT PCR: SARS Coronavirus 2 by RT PCR: POSITIVE — AB

## 2022-10-04 MED ORDER — PROMETHAZINE-DM 6.25-15 MG/5ML PO SYRP
5.0000 mL | ORAL_SOLUTION | Freq: Four times a day (QID) | ORAL | 0 refills | Status: AC | PRN
Start: 2022-10-04 — End: ?

## 2022-10-04 MED ORDER — BENZONATATE 100 MG PO CAPS
100.0000 mg | ORAL_CAPSULE | Freq: Three times a day (TID) | ORAL | 0 refills | Status: AC | PRN
Start: 2022-10-04 — End: ?

## 2022-10-04 MED ORDER — PREDNISONE 20 MG PO TABS: 40.0000 mg | ORAL_TABLET | Freq: Every day | ORAL | 0 refills | Status: AC

## 2022-10-04 NOTE — ED Provider Notes (Signed)
[facility]    CSN: [id number] Arrival date & time: 10/04/22  1348      History   Chief Complaint Chief Complaint  Patient presents with   Cough   Fever    HPI Beth Tucker is a 67 y.o. female.   67 year old female accompanied by a family member who is also sick with similar symptoms, presents with cough, runny nose, body aches and dizziness for the past 3 to 4 days. No distinct fever, vomiting or diarrhea. Does have some nausea and fatigue. Also right lower rib sore from coughing. Symptoms slightly better today. Has been using Hall's cough drops and using her Albuterol inhaler with minimal relief. No other chronic health issues except asthma. No tobacco use.   The history is provided by the patient.    Past Medical History:  Diagnosis Date   Asthma    Eczema    Fibroids     Patient Active Problem List   Diagnosis Date Noted   Encounter for screening colonoscopy     Past Surgical History:  Procedure Laterality Date   ABDOMINAL HYSTERECTOMY     pt still has ovaries and cervix   COLONOSCOPY WITH PROPOFOL N/A 10/22/2017   Procedure: COLONOSCOPY WITH PROPOFOL;  Surgeon: Midge Minium, MD;  Location: [facility];  Service: Endoscopy;  Laterality: N/A;    OB History     Gravida  4   Para  3   Term  3   Preterm  0   AB  1   Living         SAB  0   IAB  0   Ectopic  0   Multiple      Live Births               Home Medications    Prior to Admission medications   Medication Sig Start Date End Date Taking? Authorizing Provider  albuterol (VENTOLIN HFA) 108 (90 Base) MCG/ACT inhaler Inhale into the lungs. 09/21/22  Yes [provider]  benzonatate (TESSALON PERLES) 100 MG capsule Take 1 capsule (100 mg total) by mouth every 8 (eight) hours as needed for cough. 10/04/22  Yes Janace Decker, Ali Lowe, NP  predniSONE (DELTASONE) 20 MG tablet Take 2 tablets (40 mg total) by mouth daily for 5 days. 10/04/22 10/09/22 Yes Jonta Gastineau, Ali Lowe,  NP  promethazine-dextromethorphan (PROMETHAZINE-DM) 6.25-15 MG/5ML syrup Take 5 mLs by mouth every 6 (six) hours as needed for cough. Use mainly at night- will cause drowsiness 10/04/22  Yes Shyteria Lewis, Ali Lowe, NP  albuterol (VENTOLIN HFA) 108 (90 Base) MCG/ACT inhaler Inhale 1 puff into the lungs every 6 (six) hours as needed for wheezing or shortness of breath. 05/05/20 05/05/21  Eusebio Friendly B, PA-C  IRON PO Take by mouth daily.    [provider]  loratadine (CLARITIN) 10 MG tablet Take 1 tablet (10 mg total) by mouth daily. 05/05/20 08/03/20  Eusebio Friendly B, PA-C  triamcinolone cream (KENALOG) 0.1 % Apply 1 application topically 2 (two) times daily as needed. 03/17/19   Verlee Monte, NP  cetirizine (ZYRTEC) 10 MG tablet Take 1 tablet (10 mg total) by mouth daily as needed for allergies. 10/21/18 02/25/19  Vena Austria, MD  fluticasone (FLONASE) 50 MCG/ACT nasal spray Place 2 sprays into both nostrils daily. 10/21/18 03/17/19  Vena Austria, MD    Family History Family History  Problem Relation Age of Onset   Breast cancer Paternal Aunt 27   Lung  cancer Mother    Alcoholism Father    Hypertension Father     Social History Social History   Tobacco Use   Smoking status: Never   Smokeless tobacco: Never  Vaping Use   Vaping status: Never Used  Substance Use Topics   Alcohol use: No   Drug use: No     Allergies   Wasp venom, Peanut-containing drug products, Latex, Neosporin [neomycin-bacitracin zn-polymyx], and Peanut-containing drug products   Review of Systems Review of Systems  Constitutional:  Positive for activity change, appetite change, chills, fatigue and fever (slight at first- none now).  HENT:  Positive for congestion, postnasal drip, rhinorrhea, sinus pressure, sinus pain, sneezing and sore throat. Negative for ear discharge, ear pain, facial swelling, nosebleeds and trouble swallowing.   Eyes:  Positive for discharge (clear). Negative for pain and itching.   Respiratory:  Positive for cough. Negative for chest tightness and shortness of breath.   Cardiovascular:  Positive for chest pain (right lower rib).  Gastrointestinal:  Positive for nausea. Negative for diarrhea and vomiting.  Musculoskeletal:  Positive for arthralgias and myalgias. Negative for neck pain and neck stiffness.  Skin:  Negative for color change and rash.  Allergic/Immunologic: Positive for environmental allergies and food allergies. Negative for immunocompromised state.  Neurological:  Positive for dizziness and headaches. Negative for tremors, seizures, syncope, speech difficulty and numbness.  Hematological:  Negative for adenopathy. Does not bruise/bleed easily.     Physical Exam Triage Vital Signs ED Triage Vitals  Encounter Vitals Group     BP 10/04/22 1440 105/60     Systolic BP Percentile --      Diastolic BP Percentile --      Pulse Rate 10/04/22 1440 69     Resp 10/04/22 1440 18     Temp 10/04/22 1440 98.5 F (36.9 C)     Temp Source 10/04/22 1440 Oral     SpO2 10/04/22 1440 100 %     Weight 10/04/22 1435 160 lb (72.6 kg)     Height --      Head Circumference --      Peak Flow --      Pain Score 10/04/22 1437 9     Pain Loc --      Pain Education --      Exclude from Growth Chart --    No data found.  Updated Vital Signs BP 105/60 (BP Location: Left Arm)   Pulse 69   Temp 98.5 F (36.9 C) (Oral)   Resp 18   Wt 160 lb (72.6 kg)   SpO2 100%   BMI 27.46 kg/m   Visual Acuity Right Eye Distance:   Left Eye Distance:   Bilateral Distance:    Right Eye Near:   Left Eye Near:    Bilateral Near:     Physical Exam Vitals and nursing note reviewed.  Constitutional:      General: She is awake. She is not in acute distress.    Appearance: She is well-developed and well-groomed. She is ill-appearing.     Comments: She is sitting on the exam table in no acute distress but appears ill and tired.   HENT:     Head: Normocephalic and atraumatic.      Right Ear: Hearing, tympanic membrane, ear canal and external ear normal.     Left Ear: Hearing, tympanic membrane, ear canal and external ear normal.     Nose: Congestion present.     Right Sinus: No maxillary  sinus tenderness or frontal sinus tenderness.     Left Sinus: No maxillary sinus tenderness or frontal sinus tenderness.     Mouth/Throat:     Lips: Pink.     Mouth: Mucous membranes are moist.     Pharynx: Uvula midline. Posterior oropharyngeal erythema and postnasal drip present. No pharyngeal swelling, oropharyngeal exudate or uvula swelling.  Eyes:     Extraocular Movements: Extraocular movements intact.     Conjunctiva/sclera: Conjunctivae normal.  Cardiovascular:     Rate and Rhythm: Normal rate and regular rhythm.     Heart sounds: Normal heart sounds. No murmur heard. Pulmonary:     Effort: Pulmonary effort is normal. No accessory muscle usage or respiratory distress.     Breath sounds: Normal air entry. No decreased air movement. Examination of the right-upper field reveals wheezing. Examination of the left-upper field reveals wheezing. Examination of the right-middle field reveals wheezing. Examination of the right-lower field reveals wheezing. Examination of the left-lower field reveals wheezing. Wheezing present. No decreased breath sounds, rhonchi or rales.  Musculoskeletal:     Cervical back: Normal range of motion and neck supple.  Lymphadenopathy:     Cervical: No cervical adenopathy.  Skin:    General: Skin is warm and dry.     Capillary Refill: Capillary refill takes less than 2 seconds.     Findings: No rash.  Neurological:     General: No focal deficit present.     Mental Status: She is alert and oriented to person, place, and time.  Psychiatric:        Mood and Affect: Mood normal.        Behavior: Behavior normal. Behavior is cooperative.        Thought Content: Thought content normal.        Judgment: Judgment normal.      UC Treatments / Results   Labs (all labs ordered are listed, but only abnormal results are displayed) Labs Reviewed  SARS CORONAVIRUS 2 BY RT PCR - Abnormal; Notable for the following components:      Result Value   SARS Coronavirus 2 by RT PCR POSITIVE (*)    All other components within normal limits    EKG   Radiology No results found.  Procedures Procedures (including critical care time)  Medications Ordered in UC Medications - No data to display  Initial Impression / Assessment and Plan / UC Course  I have reviewed the triage vital signs and the nursing notes.  Pertinent labs & imaging results that were available during my care of the patient were reviewed by me and considered in my medical decision making (see chart for details).     Reviewed positive COVID test result with patient. She has never tested positive for COVID before. Patient's symptoms are slightly improving and on day 4 of symptoms so will not treat with antiviral at this time. Due to wheezing and history of asthma, will start Prednisone 40mg  daily for 5 days. May take Tessalon cough pills 100mg  every 8 hours as needed. Patient also requests liquid cough syrup- may take Promethazine DM every 8 hours as needed- use mainly at night. Continue to push fluids. Rest. Note written for work- patient to discuss with her work but should be out for total of 5 days at this time. May return earlier if symptoms improve, no fever and she takes appropriate precautions to limit spread at work. Follow-up in 4 to 5 days if not improving or sooner if worsening.  Final Clinical Impressions(s) / UC Diagnoses   Final diagnoses:  COVID-19 virus infection  Acute cough  History of asthma     Discharge Instructions      Recommend start Prednisone 40mg  daily for 5 days to help with congestion and inflammation. May take Tessalon cough pills 1 every 8 hours as needed. May also take Promethazine DM every 6 to 8 hours as needed for cough- use mainly at night.  Continue to push fluids. Rest. Follow-up in 4 to 5 days if not improving or sooner if worsening.     ED Prescriptions     Medication Sig Dispense Auth. Provider   predniSONE (DELTASONE) 20 MG tablet Take 2 tablets (40 mg total) by mouth daily for 5 days. 10 tablet Sudie Grumbling, NP   benzonatate (TESSALON PERLES) 100 MG capsule Take 1 capsule (100 mg total) by mouth every 8 (eight) hours as needed for cough. 30 capsule Sudie Grumbling, NP   promethazine-dextromethorphan (PROMETHAZINE-DM) 6.25-15 MG/5ML syrup Take 5 mLs by mouth every 6 (six) hours as needed for cough. Use mainly at night- will cause drowsiness 118 mL Jalon Squier, Ali Lowe, NP      PDMP not reviewed this encounter.   Sudie Grumbling, NP 10/04/22 2215

## 2022-10-04 NOTE — Discharge Instructions (Signed)
Recommend start Prednisone 40mg  daily for 5 days to help with congestion and inflammation. May take Tessalon cough pills 1 every 8 hours as needed. May also take Promethazine DM every 6 to 8 hours as needed for cough- use mainly at night. Continue to push fluids. Rest. Follow-up in 4 to 5 days if not improving or sooner if worsening.

## 2022-10-04 NOTE — ED Triage Notes (Signed)
COUGH RIB PAIN RUNNY EYES /NOSE 4  DAY AGO

## 2023-11-14 ENCOUNTER — Ambulatory Visit: Attending: Internal Medicine

## 2023-11-14 DIAGNOSIS — G4733 Obstructive sleep apnea (adult) (pediatric): Secondary | ICD-10-CM | POA: Diagnosis present

## 2023-12-11 ENCOUNTER — Other Ambulatory Visit: Payer: Self-pay

## 2023-12-11 ENCOUNTER — Emergency Department
Admission: EM | Admit: 2023-12-11 | Discharge: 2023-12-11 | Disposition: A | Discharge status: Home / Self Care | Attending: Emergency Medicine | Admitting: Emergency Medicine

## 2023-12-11 DIAGNOSIS — Y92410 Unspecified street and highway as the place of occurrence of the external cause: Secondary | ICD-10-CM | POA: Insufficient documentation

## 2023-12-11 DIAGNOSIS — M7918 Myalgia, other site: Secondary | ICD-10-CM | POA: Diagnosis present

## 2023-12-11 MED ORDER — CYCLOBENZAPRINE HCL 5 MG PO TABS: 5.0000 mg | ORAL_TABLET | Freq: Three times a day (TID) | ORAL | 0 refills | Status: DC | PRN
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
## 2023-12-11 NOTE — Discharge Instructions (Signed)
 Your exam is reassuring at this time.  You may experience some muscle stiffness and soreness for the next few days.  Take OTC Tylenol  or Motrin along with the prescription muscle relaxant as directed.  Follow-up with your primary provider for ongoing evaluation.  Return to the ED if needed.
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
## 2023-12-14 ENCOUNTER — Other Ambulatory Visit: Payer: Self-pay

## 2023-12-14 ENCOUNTER — Emergency Department

## 2023-12-14 ENCOUNTER — Emergency Department
Admission: EM | Admit: 2023-12-14 | Discharge: 2023-12-14 | Disposition: A | Discharge status: Home / Self Care | Attending: Emergency Medicine | Admitting: Emergency Medicine

## 2023-12-14 DIAGNOSIS — M7918 Myalgia, other site: Secondary | ICD-10-CM

## 2023-12-14 DIAGNOSIS — M542 Cervicalgia: Secondary | ICD-10-CM | POA: Insufficient documentation

## 2023-12-14 DIAGNOSIS — M549 Dorsalgia, unspecified: Secondary | ICD-10-CM | POA: Diagnosis not present

## 2023-12-14 DIAGNOSIS — M791 Myalgia, unspecified site: Secondary | ICD-10-CM | POA: Insufficient documentation

## 2023-12-14 MED ORDER — NAPROXEN 500 MG PO TABS: 500.0000 mg | ORAL_TABLET | Freq: Two times a day (BID) | ORAL | 0 refills | Status: AC

## 2023-12-14 MED ORDER — METHOCARBAMOL 500 MG PO TABS: 500.0000 mg | ORAL_TABLET | Freq: Three times a day (TID) | ORAL | 0 refills | Status: AC | PRN
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
## 2024-03-14 ENCOUNTER — Other Ambulatory Visit: Payer: Self-pay | Admitting: Internal Medicine

## 2024-03-14 DIAGNOSIS — Z1231 Encounter for screening mammogram for malignant neoplasm of breast: Secondary | ICD-10-CM

## 2024-04-08 ENCOUNTER — Encounter
